# Patient Record
Sex: Female | Born: 1960 | Race: Black or African American | Hispanic: No | Marital: Single | Smoking: Never smoker
Health system: Southern US, Community
[De-identification: ages and names within clinical notes are randomized; demographics above are authoritative.]

## PROBLEM LIST (undated history)

## (undated) DIAGNOSIS — K449 Diaphragmatic hernia without obstruction or gangrene: Secondary | ICD-10-CM

## (undated) DIAGNOSIS — E7800 Pure hypercholesterolemia, unspecified: Secondary | ICD-10-CM

## (undated) DIAGNOSIS — I10 Essential (primary) hypertension: Secondary | ICD-10-CM

## (undated) DIAGNOSIS — E119 Type 2 diabetes mellitus without complications: Secondary | ICD-10-CM

## (undated) DIAGNOSIS — I2699 Other pulmonary embolism without acute cor pulmonale: Secondary | ICD-10-CM

## (undated) DIAGNOSIS — U099 Post covid-19 condition, unspecified: Secondary | ICD-10-CM

## (undated) DIAGNOSIS — J45909 Unspecified asthma, uncomplicated: Secondary | ICD-10-CM

## (undated) HISTORY — DX: Unspecified asthma, uncomplicated: J45.909

## (undated) HISTORY — PX: CHOLECYSTECTOMY: SHX55

## (undated) HISTORY — DX: Post covid-19 condition, unspecified: U09.9

---

## 2016-04-07 ENCOUNTER — Emergency Department
Admission: EM | Admit: 2016-04-07 | Discharge: 2016-04-08 | Disposition: A | Discharge status: Home / Self Care | Payer: Federal, State, Local not specified - PPO | Attending: Emergency Medicine | Admitting: Emergency Medicine

## 2016-04-07 ENCOUNTER — Emergency Department: Payer: Federal, State, Local not specified - PPO

## 2016-04-07 ENCOUNTER — Encounter: Payer: Self-pay | Admitting: Emergency Medicine

## 2016-04-07 DIAGNOSIS — E119 Type 2 diabetes mellitus without complications: Secondary | ICD-10-CM | POA: Diagnosis not present

## 2016-04-07 DIAGNOSIS — Z7984 Long term (current) use of oral hypoglycemic drugs: Secondary | ICD-10-CM | POA: Diagnosis not present

## 2016-04-07 DIAGNOSIS — M79602 Pain in left arm: Secondary | ICD-10-CM | POA: Diagnosis not present

## 2016-04-07 DIAGNOSIS — R4182 Altered mental status, unspecified: Secondary | ICD-10-CM | POA: Insufficient documentation

## 2016-04-07 DIAGNOSIS — Z79899 Other long term (current) drug therapy: Secondary | ICD-10-CM | POA: Diagnosis not present

## 2016-04-07 DIAGNOSIS — I10 Essential (primary) hypertension: Secondary | ICD-10-CM | POA: Insufficient documentation

## 2016-04-07 DIAGNOSIS — Z792 Long term (current) use of antibiotics: Secondary | ICD-10-CM | POA: Diagnosis not present

## 2016-04-07 DIAGNOSIS — R079 Chest pain, unspecified: Secondary | ICD-10-CM | POA: Insufficient documentation

## 2016-04-07 DIAGNOSIS — J069 Acute upper respiratory infection, unspecified: Secondary | ICD-10-CM | POA: Insufficient documentation

## 2016-04-07 DIAGNOSIS — Z7982 Long term (current) use of aspirin: Secondary | ICD-10-CM | POA: Insufficient documentation

## 2016-04-07 DIAGNOSIS — F4321 Adjustment disorder with depressed mood: Secondary | ICD-10-CM

## 2016-04-07 DIAGNOSIS — F4320 Adjustment disorder, unspecified: Secondary | ICD-10-CM | POA: Diagnosis not present

## 2016-04-07 DIAGNOSIS — R05 Cough: Secondary | ICD-10-CM | POA: Diagnosis present

## 2016-04-07 HISTORY — DX: Type 2 diabetes mellitus without complications: E11.9

## 2016-04-07 HISTORY — DX: Essential (primary) hypertension: I10

## 2016-04-07 HISTORY — DX: Pure hypercholesterolemia, unspecified: E78.00

## 2016-04-07 HISTORY — DX: Diaphragmatic hernia without obstruction or gangrene: K44.9

## 2016-04-07 LAB — CBC
HEMATOCRIT: 44.6 % (ref 35.0–47.0)
Hemoglobin: 15.6 g/dL (ref 12.0–16.0)
MCH: 31.1 pg (ref 26.0–34.0)
MCHC: 35 g/dL (ref 32.0–36.0)
MCV: 88.9 fL (ref 80.0–100.0)
Platelets: 188 10*3/uL (ref 150–440)
RBC: 5.02 MIL/uL (ref 3.80–5.20)
RDW: 13.1 % (ref 11.5–14.5)
WBC: 6.4 10*3/uL (ref 3.6–11.0)

## 2016-04-07 LAB — COMPREHENSIVE METABOLIC PANEL
ALT: 62 U/L — AB (ref 14–54)
AST: 67 U/L — AB (ref 15–41)
Albumin: 4.1 g/dL (ref 3.5–5.0)
Alkaline Phosphatase: 49 U/L (ref 38–126)
Anion gap: 10 (ref 5–15)
BILIRUBIN TOTAL: 1 mg/dL (ref 0.3–1.2)
BUN: 10 mg/dL (ref 6–20)
CHLORIDE: 101 mmol/L (ref 101–111)
CO2: 27 mmol/L (ref 22–32)
CREATININE: 0.84 mg/dL (ref 0.44–1.00)
Calcium: 9.5 mg/dL (ref 8.9–10.3)
GFR calc Af Amer: >60 mL/min (ref >60)
GLUCOSE: 121 mg/dL — AB (ref 65–99)
Potassium: 3.6 mmol/L (ref 3.5–5.1)
Sodium: 138 mmol/L (ref 135–145)
TOTAL PROTEIN: 7 g/dL (ref 6.5–8.1)

## 2016-04-07 LAB — TROPONIN I

## 2016-04-07 MED ORDER — FENTANYL CITRATE (PF) 100 MCG/2ML IJ SOLN
50.0000 ug | Freq: Once | INTRAMUSCULAR | Status: AC
  Administered 2016-04-07: 50 ug via INTRAVENOUS

## 2016-04-07 MED ORDER — FENTANYL CITRATE (PF) 100 MCG/2ML IJ SOLN
INTRAMUSCULAR | Status: AC
  Administered 2016-04-07: 50 ug via INTRAVENOUS
  Filled 2016-04-07: qty 2

## 2016-04-07 MED ORDER — FENTANYL CITRATE (PF) 100 MCG/2ML IJ SOLN
50.0000 ug | Freq: Once | INTRAMUSCULAR | Status: AC
  Administered 2016-04-07: 50 ug via INTRAVENOUS
  Filled 2016-04-07: qty 2

## 2016-04-07 MED ORDER — SODIUM CHLORIDE 0.9 % IV BOLUS (SEPSIS)
1000.0000 mL | Freq: Once | INTRAVENOUS | Status: AC
  Administered 2016-04-07: 1000 mL via INTRAVENOUS

## 2016-04-07 NOTE — ED Notes (Signed)
Pt. Stating Lt. Chest pain, radiating to left arm. Tight cramp pain. Pt. States been fighting sinus infection for 2 weeks, cough, dizziness, congested chest and being under a lot of stress due to fiance dying recently.

## 2016-04-07 NOTE — ED Notes (Signed)
Patient transported to X-ray 

## 2016-04-07 NOTE — ED Notes (Signed)
MD at bedside. 

## 2016-04-07 NOTE — ED Notes (Signed)
Pt to STAT via w/c with no distress noted, brought in by EMS; EMS reports pt with sinus infection few weeks; midsternal palpable CP, BP 195/105, HR 105, skin hot to touch 97.9 temp, 114 FS; ASA PTA; fiance died today

## 2016-04-07 NOTE — ED Notes (Addendum)
Pt. Family requesting more pain medication at this time, and an update by MD.

## 2016-04-07 NOTE — ED Triage Notes (Signed)
Pt presents to ED with c/o chest pain and left sided weakness since about 19:00 today, pt also c/o headache x 1 week. 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

## 2016-04-07 NOTE — ED Notes (Signed)
Pt. Reports headache and taking 2 81 mg chew aspirin around 1830

## 2016-04-07 NOTE — ED Provider Notes (Signed)
[facility] Emergency Department Provider Note  Time seen: 11:51 PM  I have reviewed the triage vital signs and the nursing notes.   HISTORY  Chief Complaint Chest Pain    HPI Kristine Gilbert is a 55 y.o. female with a past medical history of diabetes, hypertension, hyperlipidemia who presents the emergency department for chest pain, left arm pain, cough and congestion. According to the patient for the past 2 weeks she has been coughing, feeling congestion in her chest with mucus production. States she has been having chest pain, worse over the past 2 days mostly located in the left chest and left arm. Patient states today she found out her fianc has died, and the chest pain was worse so she came to the emergency department. Here the patient states left chest pain, some left arm pain, cough. Denies fever. Saw her primary care doctor 2 days ago who placed her on antibiotics. Patient is quiet with a flat affect, does not make eye contact. Describes her chest pain is moderate, worse with cough.  Past Medical History:  Diagnosis Date  . Diabetes mellitus without complication (HCC)   . Hiatal hernia   . Hypercholesteremia   . Hypertension     There are no active problems to display for this patient.   Past Surgical History:  Procedure Laterality Date  . CHOLECYSTECTOMY      Prior to Admission medications   Not on File    Allergies  Allergen Reactions  . Codeine     No family history on file.  Social History Social History  Substance Use Topics  . Smoking status: Never Smoker  . Smokeless tobacco: Never Used  . Alcohol use No    Review of Systems Constitutional: Negative for fever. Cardiovascular: Positive for chest pain 2 weeks intermittently, worse over the past 2 days. Respiratory: Negative for shortness of breath. Positive for cough Gastrointestinal: Negative for abdominal pain, vomiting and diarrhea. Musculoskeletal: Negative for back  pain. Neurological: Negative for headaches, focal weakness or numbness. 10-point ROS otherwise negative.  ____________________________________________   PHYSICAL EXAM:  VITAL SIGNS: ED Triage Vitals [04/07/16 2036]  Enc Vitals Group     BP (!) 169/87     Pulse Rate (!) 106     Resp 18     Temp 98.6 F (37 C)     Temp Source Oral     SpO2 97 %     Weight 190 lb (86.2 kg)     Height 5\' 4"  (1.626 m)     Head Circumference      Peak Flow      Pain Score 7     Pain Loc      Pain Edu?      Excl. in GC?     Constitutional: Alert and oriented. Well appearing and in no distress. Eyes: Normal exam ENT   Head: Normocephalic and atraumatic.   Mouth/Throat: Mucous membranes are moist. Cardiovascular: Normal rate, regular rhythm. No murmur Respiratory: Normal respiratory effort without tachypnea nor retractions. Breath sounds are clear Gastrointestinal: Soft and nontender. No distention.  Musculoskeletal: Nontender with normal range of motion in all extremities. No lower extremity tenderness or edema. Neurologic:  Normal speech and language. No gross focal neurologic deficits Skin:  Skin is warm, dry and intact.  Psychiatric: Mood and affect are normal. Speech and behavior are normal.   ____________________________________________    EKG  EKG reviewed and interpreted by myself shows normal sinus rhythm at 99 bpm, narrow  QRS, normal axis, normal intervals, nonspecific ST changes without ST elevation.  ____________________________________________    RADIOLOGY  Chest x-ray negative Had negative  ____________________________________________   INITIAL IMPRESSION / ASSESSMENT AND PLAN / ED COURSE  Pertinent labs & imaging results that were available during my care of the patient were reviewed by me and considered in my medical decision making (see chart for details).  Patient presents emergency Department complains of intermittent chest pain for the past 2 weeks,  worsening cough and chest pain over the past 2 days. Also left arm pain with a history of TIAs in the past. Labs are normal including negative troponin. Chest x-ray is negative. CT head is negative. Patient has a very flat affect, found out her fianc died today, is admittedly depressed. I believe some of the patient's symptoms are likely due to psychiatric/mental causes. The patient's medical workup appears within normal limits. I discussed with the patient having albuterol to her antibiotics, patient is agreeable to this plan. Patient does not have SI, but admits to depression, family wishes her to speak to a psychiatrist, the patient is agreeable. She'll stay in the emergency department overnight doctor psychiatry tomorrow.  ____________________________________________   FINAL CLINICAL IMPRESSION(S) / ED DIAGNOSES  Chest pain URI    Minna AntisKevin Shelisha Gautier, MD 04/08/16 [phone]

## 2016-04-08 DIAGNOSIS — F4321 Adjustment disorder with depressed mood: Secondary | ICD-10-CM | POA: Diagnosis not present

## 2016-04-08 LAB — TROPONIN I: Troponin I: <0.03 ng/mL (ref <0.03)

## 2016-04-08 MED ORDER — ALBUTEROL SULFATE HFA 108 (90 BASE) MCG/ACT IN AERS: 2.0000 | INHALATION_SPRAY | Freq: Four times a day (QID) | RESPIRATORY_TRACT | 2 refills | Status: AC | PRN

## 2016-04-08 MED ORDER — BENZONATATE 100 MG PO CAPS: 100.0000 mg | ORAL_CAPSULE | Freq: Four times a day (QID) | ORAL | 0 refills | Status: AC | PRN

## 2016-04-08 NOTE — ED Provider Notes (Signed)
-----------------------------------------   1:42 PM on 04/08/2016 -----------------------------------------  Patient here with acute grief reaction per she is she states that she's feeling much better. There was low suspicion for cardiac enzymes to be positive and low suspicion for ACS PE or dissection and indeed serial enzymes are negative. Patient states she feels much better. She is very emotionally distraught. Psychiatry saw her, my signout was at a psychiatry felt that she was safe for discharge they would send her home. Patient is not suicidal. We will discharge her home at this time. The family's Criss Alvinerince will concern is that a meal tray was not brought to the bedside manner. Appetite expressed that we have multiple different ICU patients simultaneously taking a multiple different resources in the emergency department and is always R objective to bring food trays as a timely manner as possible. In any event, patient does not have any concerns or complaints at this time she is eager to go home. Her blood pressure slowly elevated as she states it does when she gets anxious. But she does not wish to be in the department any longer. Given her reassuring workup, and her negative cardiac enzymes we will discharge her. I will refer her to cardiology as a precaution.   Jeanmarie PlantJames A Maclin Guerrette, MD 04/08/16 1343

## 2016-04-08 NOTE — ED Notes (Addendum)
Pt. Acutely medically cleared by MD, awaiting psych consult. MD verbally ordered this RN to remove pt. IV and to remove from monitor. Order completed at this time.

## 2016-04-08 NOTE — Consult Note (Signed)
[facility] Face-to-Face Psychiatry Consult   Reason for Consult:  Consult in the emergency room for 55 year old woman who came in with chest pain that was probably psychosomatic. Acute grief. Referring Physician:  Burlene Arnt Patient Identification: Kristine Gilbert MRN:  [id number] Principal Diagnosis: Grief Diagnosis:   Patient Active Problem List   Diagnosis Date Noted  . Grief [F43.21] 04/08/2016    Total Time spent with patient: 1 hour  Subjective:   Kristine Gilbert is a 55 y.o. female patient admitted with "I don't know what I'm going to do".  HPI:  Patient seen. Case reviewed with emergency room physician. Chart reviewed. Also spoke with the patient's son who was present. This is a 63 year old woman with a history of hypertension and stroke who came to the emergency room last night with chest pain. Patient appears to be having atypical chest pain with no sign of any cardiac involvement. Consult was requested because of obvious grief. Patient's fianc died yesterday. He had been sick for a couple weeks but it sounds like it did been a pretty tumultuous and terrible experience for the patient. The 2 of them had been together for 20 years. She was not able to sleep at all last night. Mood is tearful sad and upset. Patient has made some comments to her family about thinking she could hear her fianc when there is a rustling and other environmental noises around her. On interview the patient is appropriate in discussing the death of her fianc. Tearful and upset. Able to describe details realistically. Prior to this had been feeling down for these last couple weeks and very anxious because of his illness. Hadn't been sleeping very well but had been taking any prescription medicine that was some assistance. Patient denies having any suicidal intent or plan. Admits to having had passive suicidal thoughts but has strong religious beliefs as well as devotion to her family preventing her from harming herself.  She is not abusing any drugs or alcohol. Patient's family have rallied around her and are making appropriate efforts to take care of her.  Social history: Patient just suffered the loss of her long-term partner yesterday. She has adult children one of whom is living with her and the other of whom has come up from [place] to be with her. Family is hoping to convince her to go back down to [place] where most of her family lives. Patient feels like she will probably follow-up on that. She is not having to be involved with the funeral arrangements as her fianc's family of origin are taking care of that.  Medical history: High blood pressure. History of strokes. No history of heart disease that has been otherwise identified.  Substance abuse history: Not drinking alcohol no drug abuse denies any history of substance abuse problems.  Past Psychiatric History: Patient has been seen by her primary care doctor for anxiety and sleep problems. She was prescribed nortriptyline 20 mg at night to be taken as needed for sleep. She has no history of psychiatric hospitalization or suicide attempts. No history of bipolar disorder no history of psychotic episodes.  Risk to Self: Is patient at risk for suicide?: No Risk to Others:   Prior Inpatient Therapy:   Prior Outpatient Therapy:    Past Medical History:  Past Medical History:  Diagnosis Date  . Diabetes mellitus without complication ([facility])   . Hiatal hernia   . Hypercholesteremia   . Hypertension     Past Surgical History:  Procedure Laterality Date  . CHOLECYSTECTOMY  Family History: No family history on file. Family Psychiatric  History: No known family history of mental health problems Social History:  History  Alcohol Use No     History  Drug use: Unknown    Social History   Social History  . Marital status: Single    Spouse name: N/A  . Number of children: N/A  . Years of education: N/A   Social History Main Topics  .  Smoking status: Never Smoker  . Smokeless tobacco: Never Used  . Alcohol use No  . Drug use: Unknown  . Sexual activity: Not Asked   Other Topics Concern  . None   Social History Narrative  . None   Additional Social History:    Allergies:   Allergies  Allergen Reactions  . Codeine Itching    Labs:  Results for orders placed or performed during the hospital encounter of 04/07/16 (from the past 48 hour(s))  CBC     Status: None   Collection Time: 04/07/16 10:01 PM  Result Value Ref Range   WBC 6.4 3.6 - 11.0 K/uL   RBC 5.02 3.80 - 5.20 MIL/uL   Hemoglobin 15.6 12.0 - 16.0 g/dL   HCT 44.6 35.0 - 47.0 %   MCV 88.9 80.0 - 100.0 fL   MCH 31.1 26.0 - 34.0 pg   MCHC 35.0 32.0 - 36.0 g/dL   RDW 13.1 11.5 - 14.5 %   Platelets 188 150 - 440 K/uL  Comprehensive metabolic panel     Status: Abnormal   Collection Time: 04/07/16 10:01 PM  Result Value Ref Range   Sodium 138 135 - 145 mmol/L   Potassium 3.6 3.5 - 5.1 mmol/L   Chloride 101 101 - 111 mmol/L   CO2 27 22 - 32 mmol/L   Glucose, Bld 121 (H) 65 - 99 mg/dL   BUN 10 6 - 20 mg/dL   Creatinine, Ser 0.84 0.44 - 1.00 mg/dL   Calcium 9.5 8.9 - 10.3 mg/dL   Total Protein 7.0 6.5 - 8.1 g/dL   Albumin 4.1 3.5 - 5.0 g/dL   AST 67 (H) 15 - 41 U/L   ALT 62 (H) 14 - 54 U/L   Alkaline Phosphatase 49 38 - 126 U/L   Total Bilirubin 1.0 0.3 - 1.2 mg/dL   GFR calc non Af Amer >60 >60 mL/min   GFR calc Af Amer >60 >60 mL/min    Comment: (NOTE) The eGFR has been calculated using the CKD EPI equation. This calculation has not been validated in all clinical situations. eGFR's persistently <60 mL/min signify possible Chronic Kidney Disease.    Anion gap 10 5 - 15  Troponin I     Status: None   Collection Time: 04/07/16 10:01 PM  Result Value Ref Range   Troponin I <0.03 <0.03 ng/mL  Troponin I     Status: None   Collection Time: 04/08/16  7:37 AM  Result Value Ref Range   Troponin I <0.03 <0.03 ng/mL    No current  facility-administered medications for this encounter.    Current Outpatient Prescriptions  Medication Sig Dispense Refill  . albuterol (PROVENTIL HFA;VENTOLIN HFA) 108 (90 Base) MCG/ACT inhaler Inhale 2 puffs into the lungs every 6 (six) hours as needed.    Marland Kitchen amoxicillin (AMOXIL) 500 MG capsule Take 500 mg by mouth 3 (three) times daily. 7 more days remaining    . aspirin 81 MG chewable tablet Chew by mouth daily.    Marland Kitchen atorvastatin (LIPITOR) 40  MG tablet Take 40 mg by mouth daily.    . cholecalciferol (VITAMIN D) 1000 units tablet Take 1,000 Units by mouth daily.    . clopidogrel (PLAVIX) 75 MG tablet Take 75 mg by mouth daily.    . furosemide (LASIX) 20 MG tablet Take 20 mg by mouth every other day.    Marland Kitchen glipiZIDE (GLUCOTROL XL) 5 MG 24 hr tablet Take 5 mg by mouth daily with breakfast.    . magnesium oxide (MAG-OX) 400 MG tablet Take 400 mg by mouth 3 (three) times daily.    . metFORMIN (GLUCOPHAGE) 1000 MG tablet Take 1,000 mg by mouth 2 (two) times daily with a meal.    . methocarbamol (ROBAXIN) 750 MG tablet Take 750 mg by mouth 3 (three) times daily.    . metoprolol succinate (TOPROL-XL) 50 MG 24 hr tablet Take 1 tablet by mouth daily.    . nortriptyline (PAMELOR) 10 MG capsule Take 20 mg by mouth at bedtime.    . potassium chloride (K-DUR) 10 MEQ tablet Take 1 tablet by mouth every other day.    . rizatriptan (MAXALT-MLT) 10 MG disintegrating tablet Take 10 mg by mouth as needed for migraine. May repeat in 2 hours if needed    . sitaGLIPtin (JANUVIA) 100 MG tablet Take 100 mg by mouth daily.    . valsartan-hydrochlorothiazide (DIOVAN-HCT) 320-12.5 MG tablet Take 1 tablet by mouth daily.    . vitamin B-12 (CYANOCOBALAMIN) 1000 MCG tablet Take 1,000 mcg by mouth daily.    Marland Kitchen albuterol (PROVENTIL HFA;VENTOLIN HFA) 108 (90 Base) MCG/ACT inhaler Inhale 2 puffs into the lungs every 6 (six) hours as needed for wheezing or shortness of breath. 1 Inhaler 2  . atomoxetine (STRATTERA) 40 MG capsule  Take 40 mg by mouth daily.    . benzonatate (TESSALON PERLES) 100 MG capsule Take 1 capsule (100 mg total) by mouth every 6 (six) hours as needed for cough. 30 capsule 0    Musculoskeletal: Strength & Muscle Tone: within normal limits Gait & Station: normal Patient leans: N/A  Psychiatric Specialty Exam: Physical Exam  Nursing note and vitals reviewed. Constitutional: She appears well-developed and well-nourished.  HENT:  Head: Normocephalic and atraumatic.  Eyes: Conjunctivae are normal. Pupils are equal, round, and reactive to light.  Neck: Normal range of motion.  Cardiovascular: Regular rhythm and normal heart sounds.   Respiratory: Effort normal. No respiratory distress.  GI: Soft.  Musculoskeletal: Normal range of motion.  Neurological: She is alert.  Skin: Skin is warm and dry.  Psychiatric: Judgment normal. Her speech is delayed. She is slowed. Cognition and memory are normal. She exhibits a depressed mood. She expresses no suicidal ideation.    Review of Systems  Constitutional: Negative.   HENT: Negative.   Eyes: Negative.   Respiratory: Negative.   Cardiovascular: Negative.   Gastrointestinal: Negative.   Musculoskeletal: Negative.   Skin: Negative.   Neurological: Negative.   Psychiatric/Behavioral: Positive for depression and hallucinations. Negative for memory loss, substance abuse and suicidal ideas. The patient is nervous/anxious and has insomnia.     Blood pressure (!) 153/95, pulse 92, temperature 98.6 F (37 C), temperature source Oral, resp. rate 18, height _0  (1.626 m), weight 86.2 kg (190 lb), SpO2 98 %.Body mass index is 32.61 kg/m.  General Appearance: Casual  Eye Contact:  Minimal  Speech:  Slow  Volume:  Decreased  Mood:  Depressed  Affect:  Tearful  Thought Process:  Goal Directed  Orientation:  Full (Time,  Place, and Person)  Thought Content:  Logical  Suicidal Thoughts:  No  Homicidal Thoughts:  No  Memory:  Immediate;   Good Recent;    Fair Remote;   Fair  Judgement:  Good  Insight:  Good  Psychomotor Activity:  Decreased  Concentration:  Concentration: Fair  Recall:  [place] of Knowledge:  Good  Language:  Good  Akathisia:  No  Handed:  Right  AIMS (if indicated):     Assets:  Communication Skills Desire for Improvement Housing Resilience Social Support  ADL's:  Intact  Cognition:  WNL  Sleep:        Treatment Plan Summary: Plan 55 year old woman who is having what appears to be very normal grief in response to a terrible loss. The talk about hearing the voice of her fianc is not considered abnormal in this circumstance. Does not appear to be actually psychotic. No evidence of actual intent or plan or thought of harming herself. Patient has been appropriately cooperative and open to assistance from her family. Patient does not require inpatient psychiatric treatment. Spent quite a bit of time doing supportive work with her discussing grief discussing grief management. I don't think she needs any new prescriptions. I encouraged her to use the nortriptyline if it has been helpful at the current dose but not to increase it. Spoke with the patient and her son and suggested that they see an outpatient doctor or therapist if the situation were to get worse. Reviewed symptoms of major depression. Reviewed case with the ER physician. Doesn't require inpatient treatment. Can be released from the ER.  Disposition: Patient does not meet criteria for psychiatric inpatient admission. Supportive therapy provided about ongoing stressors.  Alethia Berthold, MD 04/08/2016 4:39 PM

## 2019-12-08 ENCOUNTER — Other Ambulatory Visit (HOSPITAL_COMMUNITY)
Admission: RE | Admit: 2019-12-08 | Discharge: 2019-12-08 | Disposition: A | Discharge status: Home / Self Care | Payer: Federal, State, Local not specified - PPO | Source: Ambulatory Visit | Attending: Obstetrics and Gynecology | Admitting: Obstetrics and Gynecology

## 2019-12-08 ENCOUNTER — Ambulatory Visit: Payer: Federal, State, Local not specified - PPO | Admitting: Obstetrics and Gynecology

## 2019-12-08 ENCOUNTER — Encounter: Payer: Self-pay | Admitting: Obstetrics and Gynecology

## 2019-12-08 ENCOUNTER — Other Ambulatory Visit: Payer: Self-pay

## 2019-12-08 VITALS — BP 119/87 | HR 91 | Ht 65.0 in | Wt 169.0 lb

## 2019-12-08 DIAGNOSIS — N898 Other specified noninflammatory disorders of vagina: Secondary | ICD-10-CM | POA: Insufficient documentation

## 2019-12-08 DIAGNOSIS — Z01419 Encounter for gynecological examination (general) (routine) without abnormal findings: Secondary | ICD-10-CM | POA: Diagnosis not present

## 2019-12-08 DIAGNOSIS — Z1231 Encounter for screening mammogram for malignant neoplasm of breast: Secondary | ICD-10-CM

## 2019-12-08 NOTE — Progress Notes (Signed)
HPI:      [name] is a 59 y.o. No obstetric history on file. who LMP was No LMP recorded. Patient has had an ablation.  Subjective:   She presents today for her annual examination.  She complains of a vaginal discharge with occasional burning.  She denies new sexual partners.  But is not opposed to being tested for "everything".  She states it has been present for 3 days.  Occasionally is worse with urination but she does not think it is a bladder infection. Patient is in menopause but has had an endometrial ablation.  She has no bleeding.  She does say that she had severe hot flashes but was placed on Zoloft.  It is working well for her(she cannot take any estrogen compounds because she has a history of 2 previous " strokes")    Hx: The following portions of the patient's history were reviewed and updated as appropriate:             She  has a past medical history of Diabetes mellitus without complication (HCC), Hiatal hernia, Hypercholesteremia, and Hypertension. She does not have any pertinent problems on file. She  has a past surgical history that includes Cholecystectomy. Her family history is not on file. She  reports that she has never smoked. She has never used smokeless tobacco. She reports that she does not drink alcohol. No history on file for drug. She has a current medication list which includes the following prescription(s): albuterol, albuterol, aspirin, atomoxetine, atorvastatin, cholecalciferol, furosemide, glipizide, magnesium oxide, metformin, methocarbamol, nortriptyline, potassium chloride, rizatriptan, sertraline, valsartan-hydrochlorothiazide, metoprolol succinate, ozempic (1 mg/dose), sitagliptin, and vitamin b-12. She is allergic to codeine.       Review of Systems:  Review of Systems  Constitutional: Denied constitutional symptoms, night sweats, recent illness, fatigue, fever, insomnia and weight loss.  Eyes: Denied eye symptoms, eye pain, photophobia,  vision change and visual disturbance.  Ears/Nose/Throat/Neck: Denied ear, nose, throat or neck symptoms, hearing loss, nasal discharge, sinus congestion and sore throat.  Cardiovascular: Denied cardiovascular symptoms, arrhythmia, chest pain/pressure, edema, exercise intolerance, orthopnea and palpitations.  Respiratory: Denied pulmonary symptoms, asthma, pleuritic pain, productive sputum, cough, dyspnea and wheezing.  Gastrointestinal: Denied, gastro-esophageal reflux, melena, nausea and vomiting.  Genitourinary: See HPI for additional information.  Musculoskeletal: Denied musculoskeletal symptoms, stiffness, swelling, muscle weakness and myalgia.  Dermatologic: Denied dermatology symptoms, rash and scar.  Neurologic: Denied neurology symptoms, dizziness, headache, neck pain and syncope.  Psychiatric: Denied psychiatric symptoms, anxiety and depression.  Endocrine: Denied endocrine symptoms including hot flashes and night sweats.   Meds:   Current Outpatient Medications on File Prior to Visit  Medication Sig Dispense Refill  . albuterol (PROVENTIL HFA;VENTOLIN HFA) 108 (90 Base) MCG/ACT inhaler Inhale 2 puffs into the lungs every 6 (six) hours as needed for wheezing or shortness of breath. 1 Inhaler 2  . albuterol (PROVENTIL HFA;VENTOLIN HFA) 108 (90 Base) MCG/ACT inhaler Inhale 2 puffs into the lungs every 6 (six) hours as needed.    Marland Kitchen aspirin 81 MG chewable tablet Chew by mouth daily.    Marland Kitchen atomoxetine (STRATTERA) 40 MG capsule Take 40 mg by mouth daily.    Marland Kitchen atorvastatin (LIPITOR) 40 MG tablet Take 40 mg by mouth daily.    . cholecalciferol (VITAMIN D) 1000 units tablet Take 1,000 Units by mouth daily.    . furosemide (LASIX) 20 MG tablet Take 20 mg by mouth every other day.    Marland Kitchen glipiZIDE (GLUCOTROL XL) 5 MG 24  hr tablet Take 5 mg by mouth daily with breakfast.    . magnesium oxide (MAG-OX) 400 MG tablet Take 400 mg by mouth 3 (three) times daily.    . metFORMIN (GLUCOPHAGE) 1000 MG  tablet Take 1,000 mg by mouth 2 (two) times daily with a meal.    . methocarbamol (ROBAXIN) 750 MG tablet Take 750 mg by mouth 3 (three) times daily.    . nortriptyline (PAMELOR) 10 MG capsule Take 20 mg by mouth at bedtime.    . potassium chloride (K-DUR) 10 MEQ tablet Take 1 tablet by mouth every other day.    . rizatriptan (MAXALT-MLT) 10 MG disintegrating tablet Take 10 mg by mouth as needed for migraine. May repeat in 2 hours if needed    . sertraline (ZOLOFT) 25 MG tablet Take by mouth.    . valsartan-hydrochlorothiazide (DIOVAN-HCT) 320-12.5 MG tablet Take 1 tablet by mouth daily.    . metoprolol succinate (TOPROL-XL) 50 MG 24 hr tablet Take 1 tablet by mouth daily.    Marland Kitchen OZEMPIC, 1 MG/DOSE, 4 MG/3ML SOPN ADMINISTER 1 MG UNDER THE SKIN 1 TIME A WEEK    . sitaGLIPtin (JANUVIA) 100 MG tablet Take 100 mg by mouth daily.    . vitamin B-12 (CYANOCOBALAMIN) 1000 MCG tablet Take 1,000 mcg by mouth daily.     No current facility-administered medications on file prior to visit.    Objective:     Vitals:   12/08/19 1444  BP: 119/87  Pulse: 91              Physical examination General NAD, Conversant  HEENT Atraumatic; Op clear with mmm.  Normo-cephalic. Pupils reactive. Anicteric sclerae  Thyroid/Neck Smooth without nodularity or enlargement. Normal ROM.  Neck Supple.  Skin No rashes, lesions or ulceration. Normal palpated skin turgor. No nodularity.  Breasts: No masses or discharge.  Symmetric.  No axillary adenopathy.  Lungs: Clear to auscultation.No rales or wheezes. Normal Respiratory effort, no retractions.  Heart: NSR.  No murmurs or rubs appreciated. No periferal edema  Abdomen: Soft.  Non-tender.  No masses.  No HSM. No hernia  Extremities: Moves all appropriately.  Normal ROM for age. No lymphadenopathy.  Neuro: Oriented to PPT.  Normal mood. Normal affect.     Pelvic:   Vulva: Normal appearance.  No lesions.  Vagina: No lesions or abnormalities noted.  Support: Normal  pelvic support.  Urethra No masses tenderness or scarring.  Meatus Normal size without lesions or prolapse.  Cervix: Normal appearance.  No lesions.  Anus: Normal exam.  No lesions.  Perineum: Normal exam.  No lesions.        Bimanual   Uterus: Normal size.  Non-tender.  Mobile.  AV.  Adnexae: No masses.  Non-tender to palpation.  Cul-de-sac: Negative for abnormality.      Assessment:    No obstetric history on file. Patient Active Problem List   Diagnosis Date Noted  . Grief 04/08/2016     1. Well woman exam with routine gynecological exam   2. Screening mammogram, encounter for   3. Vaginal discharge     Normal exam   Plan:            1.  Basic Screening Recommendations The basic screening recommendations for asymptomatic women were discussed with the patient during her visit.  The age-appropriate recommendations were discussed with her and the rational for the tests reviewed.  When I am informed by the patient that another primary care physician has previously  obtained the age-appropriate tests and they are up-to-date, only outstanding tests are ordered and referrals given as necessary.  Abnormal results of tests will be discussed with her when all of her results are completed.  Routine preventative health maintenance measures emphasized: Exercise/Diet/Weight control, Tobacco Warnings, Alcohol/Substance use risks and Stress Management Pap next year -mammogram ordered -patient gets blood work through PCP 2. Nuswab sent -we will contact patient's if any results positive. Orders Orders Placed This Encounter  Procedures  . MM 3D SCREEN BREAST BILATERAL    No orders of the defined types were placed in this encounter.       F/U  Return in about 1 year (around 12/07/2020) for We will contact her with any abnormal test results.  Elonda Husky, M.D. 12/08/2019 3:33 PM

## 2019-12-12 LAB — CERVICOVAGINAL ANCILLARY ONLY
Bacterial Vaginitis (gardnerella): NEGATIVE
Candida Glabrata: NEGATIVE
Candida Vaginitis: NEGATIVE
Chlamydia: NEGATIVE
Comment: NEGATIVE
Comment: NEGATIVE
Comment: NEGATIVE
Comment: NEGATIVE
Comment: NEGATIVE
Comment: NORMAL
Neisseria Gonorrhea: NEGATIVE
Trichomonas: NEGATIVE

## 2020-12-14 ENCOUNTER — Other Ambulatory Visit: Payer: Self-pay

## 2020-12-14 ENCOUNTER — Encounter: Payer: Self-pay | Admitting: Obstetrics and Gynecology

## 2020-12-14 ENCOUNTER — Other Ambulatory Visit (HOSPITAL_COMMUNITY)
Admission: RE | Admit: 2020-12-14 | Discharge: 2020-12-14 | Disposition: A | Discharge status: Home / Self Care | Payer: Federal, State, Local not specified - PPO | Source: Ambulatory Visit | Attending: Obstetrics and Gynecology | Admitting: Obstetrics and Gynecology

## 2020-12-14 ENCOUNTER — Ambulatory Visit (INDEPENDENT_AMBULATORY_CARE_PROVIDER_SITE_OTHER): Payer: Federal, State, Local not specified - PPO | Admitting: Obstetrics and Gynecology

## 2020-12-14 VITALS — BP 124/81 | HR 89 | Ht 64.0 in | Wt 165.4 lb

## 2020-12-14 DIAGNOSIS — Z124 Encounter for screening for malignant neoplasm of cervix: Secondary | ICD-10-CM | POA: Insufficient documentation

## 2020-12-14 DIAGNOSIS — Z1231 Encounter for screening mammogram for malignant neoplasm of breast: Secondary | ICD-10-CM

## 2020-12-14 DIAGNOSIS — R319 Hematuria, unspecified: Secondary | ICD-10-CM

## 2020-12-14 DIAGNOSIS — R300 Dysuria: Secondary | ICD-10-CM

## 2020-12-14 DIAGNOSIS — Z01419 Encounter for gynecological examination (general) (routine) without abnormal findings: Secondary | ICD-10-CM

## 2020-12-14 LAB — POCT URINALYSIS DIPSTICK
Bilirubin, UA: NEGATIVE
Glucose, UA: NEGATIVE
Ketones, UA: NEGATIVE
Leukocytes, UA: NEGATIVE
Nitrite, UA: NEGATIVE
Protein, UA: NEGATIVE
Spec Grav, UA: 1.01 (ref 1.010–1.025)
Urobilinogen, UA: 0.2 E.U./dL
pH, UA: 6.5 (ref 5.0–8.0)

## 2020-12-14 NOTE — Progress Notes (Signed)
HPI:      [name] is a 60 y.o. No obstetric history on file. who LMP was No LMP recorded. Patient has had an ablation.  Subjective:   She presents today for her annual examination.  She states that she is doing well.  She does complain of occasional burning with urination.  She reports no vaginal bleeding. She continues to have occasional hot flashes but these are not disabling as they used to be.  She takes Zoloft to control her hot flashes.  She is unable to take estrogen because of a history of 2 previous strokes. She has had an endometrial ablation.    Hx: The following portions of the patient's history were reviewed and updated as appropriate:             She  has a past medical history of Diabetes mellitus without complication (HCC), Hiatal hernia, Hypercholesteremia, and Hypertension. She does not have any pertinent problems on file. She  has a past surgical history that includes Cholecystectomy. Her family history includes Cancer in her mother; Diabetes in her father and mother; Throat cancer in her father. She  reports that she has never smoked. She has never used smokeless tobacco. She reports previous drug use. She reports that she does not drink alcohol. She has a current medication list which includes the following prescription(s): albuterol, aspirin, atomoxetine, atorvastatin, cholecalciferol, furosemide, glipizide, magnesium oxide, metformin, methocarbamol, nortriptyline, ozempic (1 mg/dose), potassium chloride, rizatriptan, valsartan-hydrochlorothiazide, vitamin b-12, metoprolol succinate, sertraline, and sitagliptin. She is allergic to codeine.       Review of Systems:  Review of Systems  Constitutional: Denied constitutional symptoms, night sweats, recent illness, fatigue, fever, insomnia and weight loss.  Eyes: Denied eye symptoms, eye pain, photophobia, vision change and visual disturbance.  Ears/Nose/Throat/Neck: Denied ear, nose, throat or neck symptoms,  hearing loss, nasal discharge, sinus congestion and sore throat.  Cardiovascular: Denied cardiovascular symptoms, arrhythmia, chest pain/pressure, edema, exercise intolerance, orthopnea and palpitations.  Respiratory: Denied pulmonary symptoms, asthma, pleuritic pain, productive sputum, cough, dyspnea and wheezing.  Gastrointestinal: Denied, gastro-esophageal reflux, melena, nausea and vomiting.  Genitourinary: Denied genitourinary symptoms including symptomatic vaginal discharge, pelvic relaxation issues, and urinary complaints.  Musculoskeletal: Denied musculoskeletal symptoms, stiffness, swelling, muscle weakness and myalgia.  Dermatologic: Denied dermatology symptoms, rash and scar.  Neurologic: Denied neurology symptoms, dizziness, headache, neck pain and syncope.  Psychiatric: Denied psychiatric symptoms, anxiety and depression.  Endocrine: Denied endocrine symptoms including hot flashes and night sweats.   Meds:   Current Outpatient Medications on File Prior to Visit  Medication Sig Dispense Refill  . albuterol (PROVENTIL HFA;VENTOLIN HFA) 108 (90 Base) MCG/ACT inhaler Inhale 2 puffs into the lungs every 6 (six) hours as needed for wheezing or shortness of breath. 1 Inhaler 2  . aspirin 81 MG chewable tablet Chew by mouth daily.    Marland Kitchen atomoxetine (STRATTERA) 40 MG capsule Take 40 mg by mouth daily.    Marland Kitchen atorvastatin (LIPITOR) 40 MG tablet Take 40 mg by mouth daily.    . cholecalciferol (VITAMIN D) 1000 units tablet Take 1,000 Units by mouth daily.    . furosemide (LASIX) 20 MG tablet Take 20 mg by mouth every other day.    Marland Kitchen glipiZIDE (GLUCOTROL XL) 5 MG 24 hr tablet Take 5 mg by mouth daily with breakfast.    . magnesium oxide (MAG-OX) 400 MG tablet Take 400 mg by mouth 3 (three) times daily.    . metFORMIN (GLUCOPHAGE) 1000 MG tablet Take 1,000 mg  by mouth 2 (two) times daily with a meal.    . methocarbamol (ROBAXIN) 750 MG tablet Take 750 mg by mouth 3 (three) times daily.    .  nortriptyline (PAMELOR) 10 MG capsule Take 20 mg by mouth at bedtime.    Marland Kitchen OZEMPIC, 1 MG/DOSE, 4 MG/3ML SOPN ADMINISTER 1 MG UNDER THE SKIN 1 TIME A WEEK    . potassium chloride (K-DUR) 10 MEQ tablet Take 1 tablet by mouth every other day.    . rizatriptan (MAXALT-MLT) 10 MG disintegrating tablet Take 10 mg by mouth as needed for migraine. May repeat in 2 hours if needed    . valsartan-hydrochlorothiazide (DIOVAN-HCT) 320-12.5 MG tablet Take 1 tablet by mouth daily.    . vitamin B-12 (CYANOCOBALAMIN) 1000 MCG tablet Take 1,000 mcg by mouth daily.    . metoprolol succinate (TOPROL-XL) 50 MG 24 hr tablet Take 1 tablet by mouth daily. (Patient not taking: Reported on 12/14/2020)    . sertraline (ZOLOFT) 25 MG tablet Take by mouth.    . sitaGLIPtin (JANUVIA) 100 MG tablet Take 100 mg by mouth daily. (Patient not taking: Reported on 12/14/2020)     No current facility-administered medications on file prior to visit.          Objective:     Vitals:   12/14/20 1114  BP: 124/81  Pulse: 89    Filed Weights   12/14/20 1114  Weight: 165 lb 6.4 oz (75 kg)              Physical examination General NAD, Conversant  HEENT Atraumatic; Op clear with mmm.  Normo-cephalic. Pupils reactive. Anicteric sclerae  Thyroid/Neck Smooth without nodularity or enlargement. Normal ROM.  Neck Supple.  Skin No rashes, lesions or ulceration. Normal palpated skin turgor. No nodularity.  Breasts: No masses or discharge.  Symmetric.  No axillary adenopathy.  Lungs: Clear to auscultation.No rales or wheezes. Normal Respiratory effort, no retractions.  Heart: NSR.  No murmurs or rubs appreciated. No periferal edema  Abdomen: Soft.  Non-tender.  No masses.  No HSM. No hernia  Extremities: Moves all appropriately.  Normal ROM for age. No lymphadenopathy.  Neuro: Oriented to PPT.  Normal mood. Normal affect.     Pelvic:   Vulva: Normal appearance.  No lesions.  Vagina: No lesions or abnormalities noted.  Moderate  atrophy noted  Support: Normal pelvic support.  Urethra No masses tenderness or scarring.  Meatus Normal size without lesions or prolapse.  Cervix: Normal appearance.  No lesions.  Anus: Normal exam.  No lesions.  Perineum: Normal exam.  No lesions.        Bimanual   Uterus: Normal size.  Non-tender.  Mobile.  AV.  Adnexae: No masses.  Non-tender to palpation.  Cul-de-sac: Negative for abnormality.      Assessment:    No obstetric history on file. Patient Active Problem List   Diagnosis Date Noted  . Grief 04/08/2016     1. Well woman exam with routine gynecological exam   2. Screening mammogram, encounter for   3. Dysuria   4. Screening for cervical cancer   5. Hematuria of unknown etiology    Patient states she often has hematuria and had a urologic work-up for this and they found no diagnosis.     Plan:            1.  Basic Screening Recommendations The basic screening recommendations for asymptomatic women were discussed with the patient during her visit.  The  age-appropriate recommendations were discussed with her and the rational for the tests reviewed.  When I am informed by the patient that another primary care physician has previously obtained the age-appropriate tests and they are up-to-date, only outstanding tests are ordered and referrals given as necessary.  Abnormal results of tests will be discussed with her when all of her results are completed.  Routine preventative health maintenance measures emphasized: Exercise/Diet/Weight control, Tobacco Warnings, Alcohol/Substance use risks and Stress Management Pap performed-mammogram ordered. 2.  Hematuria noted - urine sent for C&S.  Orders Orders Placed This Encounter  Procedures  . Urine Culture  . MM 3D SCREEN BREAST BILATERAL  . POCT urinalysis dipstick    No orders of the defined types were placed in this encounter.         F/U  No follow-ups on file.  Elonda Husky, M.D. 12/14/2020 11:45 AM

## 2020-12-17 LAB — CYTOLOGY - PAP
Comment: NEGATIVE
Diagnosis: NEGATIVE
High risk HPV: NEGATIVE

## 2020-12-17 LAB — URINE CULTURE: Organism ID, Bacteria: NO GROWTH

## 2021-06-13 ENCOUNTER — Ambulatory Visit
Admission: RE | Admit: 2021-06-13 | Discharge: 2021-06-13 | Disposition: A | Discharge status: Home / Self Care | Payer: Federal, State, Local not specified - PPO | Source: Ambulatory Visit | Attending: Family Medicine | Admitting: Family Medicine

## 2021-06-13 ENCOUNTER — Ambulatory Visit
Admission: RE | Admit: 2021-06-13 | Discharge: 2021-06-13 | Disposition: A | Discharge status: Home / Self Care | Payer: Federal, State, Local not specified - PPO | Attending: Family Medicine | Admitting: Family Medicine

## 2021-06-13 ENCOUNTER — Other Ambulatory Visit: Payer: Self-pay | Admitting: Family Medicine

## 2021-06-13 ENCOUNTER — Other Ambulatory Visit: Payer: Self-pay

## 2021-06-13 DIAGNOSIS — M25562 Pain in left knee: Secondary | ICD-10-CM

## 2021-10-21 ENCOUNTER — Other Ambulatory Visit: Payer: Self-pay

## 2021-10-21 ENCOUNTER — Ambulatory Visit
Admission: RE | Admit: 2021-10-21 | Discharge: 2021-10-21 | Disposition: A | Discharge status: Home / Self Care | Payer: Federal, State, Local not specified - PPO | Source: Ambulatory Visit | Attending: Internal Medicine | Admitting: Internal Medicine

## 2021-10-21 VITALS — BP 139/86 | HR 87 | Temp 98.6°F | Resp 16

## 2021-10-21 DIAGNOSIS — H6121 Impacted cerumen, right ear: Secondary | ICD-10-CM

## 2021-10-21 DIAGNOSIS — R519 Headache, unspecified: Secondary | ICD-10-CM | POA: Diagnosis not present

## 2021-10-21 DIAGNOSIS — M62838 Other muscle spasm: Secondary | ICD-10-CM

## 2021-10-21 DIAGNOSIS — G44209 Tension-type headache, unspecified, not intractable: Secondary | ICD-10-CM

## 2021-10-21 MED ORDER — KETOROLAC TROMETHAMINE 10 MG PO TABS: 10.0000 mg | ORAL_TABLET | Freq: Four times a day (QID) | ORAL | 0 refills | Status: DC | PRN

## 2021-10-21 MED ORDER — BACLOFEN 10 MG PO TABS: 10.0000 mg | ORAL_TABLET | Freq: Three times a day (TID) | ORAL | 0 refills | Status: DC

## 2021-10-21 MED ORDER — PSEUDOEPHEDRINE HCL ER 120 MG PO TB12: 120.0000 mg | ORAL_TABLET | Freq: Two times a day (BID) | ORAL | 0 refills | Status: DC

## 2021-10-21 NOTE — ED Provider Notes (Signed)
Kristine Gilbert    CSN: [id number] Arrival date & time: 10/21/21  1148      History   Chief Complaint Chief Complaint  Patient presents with   Headache   Nasal Congestion   Otalgia   Neck Pain    HPI Kristine Gilbert is a 61 y.o. female who presents with HA, nose congestion, bilateral ear pain and neck pain x 1 week. Has had sinus pressure. Denies having a fever.  Was very chilli for 2 days only, has not had body aches, but has aches on her L neck and L trap area. Denies an injury.     Past Medical History:  Diagnosis Date   Diabetes mellitus without complication (HCC)    Hiatal hernia    Hypercholesteremia    Hypertension     Patient Active Problem List   Diagnosis Date Noted   Grief 04/08/2016    Past Surgical History:  Procedure Laterality Date   CHOLECYSTECTOMY      OB History   No obstetric history on file.      Home Medications    Prior to Admission medications   Medication Sig Start Date End Date Taking? Authorizing Provider  baclofen (LIORESAL) 10 MG tablet Take 1 tablet (10 mg total) by mouth 3 (three) times daily. 10/21/21  Yes Rodriguez-Southworth, Nettie Elm, PA-C  ketorolac (TORADOL) 10 MG tablet Take 1 tablet (10 mg total) by mouth every 6 (six) hours as needed. Prn headache 10/21/21  Yes Rodriguez-Southworth, Nettie Elm, PA-C  pseudoephedrine (SUDAFED 12 HOUR) 120 MG 12 hr tablet Take 1 tablet (120 mg total) by mouth 2 (two) times daily. 10/21/21  Yes Rodriguez-Southworth, Nettie Elm, PA-C  albuterol (PROVENTIL HFA;VENTOLIN HFA) 108 (90 Base) MCG/ACT inhaler Inhale 2 puffs into the lungs every 6 (six) hours as needed for wheezing or shortness of breath. 04/08/16   Minna Antis, MD  aspirin 81 MG chewable tablet Chew by mouth daily.    [provider]  atomoxetine (STRATTERA) 40 MG capsule Take 40 mg by mouth daily.    [provider]  atorvastatin (LIPITOR) 40 MG tablet Take 40 mg by mouth daily.    [provider]   cholecalciferol (VITAMIN D) 1000 units tablet Take 1,000 Units by mouth daily.    [provider]  furosemide (LASIX) 20 MG tablet Take 20 mg by mouth every other day.    [provider]  glipiZIDE (GLUCOTROL XL) 5 MG 24 hr tablet Take 5 mg by mouth daily with breakfast.    [provider]  magnesium oxide (MAG-OX) 400 MG tablet Take 400 mg by mouth 3 (three) times daily.    [provider]  metFORMIN (GLUCOPHAGE) 1000 MG tablet Take 1,000 mg by mouth 2 (two) times daily with a meal.    [provider]  methocarbamol (ROBAXIN) 750 MG tablet Take 750 mg by mouth 3 (three) times daily.    [provider]  metoprolol succinate (TOPROL-XL) 50 MG 24 hr tablet Take 1 tablet by mouth daily. Patient not taking: Reported on 12/14/2020 12/28/15   [provider]  nortriptyline (PAMELOR) 10 MG capsule Take 20 mg by mouth at bedtime.    [provider]  OZEMPIC, 1 MG/DOSE, 4 MG/3ML SOPN ADMINISTER 1 MG UNDER THE SKIN 1 TIME A WEEK 11/28/19   [provider]  potassium chloride (K-DUR) 10 MEQ tablet Take 1 tablet by mouth every other day. 12/28/15   [provider]  rizatriptan (MAXALT-MLT) 10 MG disintegrating tablet Take 10  mg by mouth as needed for migraine. May repeat in 2 hours if needed    [provider]  sertraline (ZOLOFT) 25 MG tablet Take by mouth. 01/25/18 03/21/20  [provider]  sitaGLIPtin (JANUVIA) 100 MG tablet Take 100 mg by mouth daily. Patient not taking: Reported on 12/14/2020    [provider]  valsartan-hydrochlorothiazide (DIOVAN-HCT) 320-12.5 MG tablet Take 1 tablet by mouth daily.    [provider]  vitamin B-12 (CYANOCOBALAMIN) 1000 MCG tablet Take 1,000 mcg by mouth daily.    [provider]    Family History Family History  Problem Relation Age of Onset   Diabetes Mother    Cancer Mother    Diabetes Father    Throat cancer Father     Social  History Social History   Tobacco Use   Smoking status: Never   Smokeless tobacco: Never  Substance Use Topics   Alcohol use: No   Drug use: Not Currently     Allergies   Codeine   Review of Systems Review of Systems  Constitutional:  Positive for chills and fatigue. Negative for activity change, appetite change, diaphoresis and fever.  HENT:  Positive for congestion, ear pain and postnasal drip. Negative for ear discharge, sore throat and trouble swallowing.   Eyes:  Negative for photophobia, discharge and visual disturbance.  Respiratory:  Negative for cough.   Musculoskeletal:  Positive for neck pain and neck stiffness. Negative for myalgias.  Neurological:  Positive for headaches.  Hematological:  Negative for adenopathy.    Physical Exam Triage Vital Signs ED Triage Vitals  Enc Vitals Group     BP 10/21/21 1212 139/86     Pulse Rate 10/21/21 1212 87     Resp 10/21/21 1212 16     Temp 10/21/21 1212 98.6 F (37 C)     Temp Source 10/21/21 1212 Oral     SpO2 10/21/21 1212 95 %     Weight --      Height --      Head Circumference --      Peak Flow --      Pain Score 10/21/21 1220 6     Pain Loc --      Pain Edu? --      Excl. in GC? --    No data found.  Updated Vital Signs BP 139/86 (BP Location: Right Arm)    Pulse 87    Temp 98.6 F (37 C) (Oral)    Resp 16    SpO2 98%   Visual Acuity Right Eye Distance:   Left Eye Distance:   Bilateral Distance:    Right Eye Near:   Left Eye Near:    Bilateral Near:    Repeated pulse ox was 98% Physical Exam Vitals and nursing note reviewed.  Constitutional:      General: She is not in acute distress.    Appearance: She is not toxic-appearing.  HENT:     Head: Normocephalic.     Right Ear: Tympanic membrane, ear canal and external ear normal.     Left Ear: Tympanic membrane, ear canal and external ear normal.     Ears:     Comments: R TM was initially not visible, but after lavage it was normal.     Nose:  Nose normal.     Comments: Has normal sinus transluminations    Mouth/Throat:     Mouth: Mucous membranes are moist.     Pharynx: Oropharynx is clear.  Eyes:     General: No scleral icterus.    Conjunctiva/sclera: Conjunctivae normal.  Neck:     Comments: Has tense and tender L  upper trap muscle Cardiovascular:     Rate and Rhythm: Normal rate and regular rhythm.  Pulmonary:     Effort: Pulmonary effort is normal.     Breath sounds: Normal breath sounds.  Musculoskeletal:        General: Normal range of motion.     Cervical back: Neck supple.  Skin:    General: Skin is warm and dry.     Findings: No rash.  Neurological:     Mental Status: She is alert and oriented to person, place, and time.     Gait: Gait normal.     Deep Tendon Reflexes: Reflexes normal.  Psychiatric:        Mood and Affect: Mood normal.        Behavior: Behavior normal.        Thought Content: Thought content normal.        Judgment: Judgment normal.     UC Treatments / Results  Labs (all labs ordered are listed, but only abnormal results are displayed) Labs Reviewed  NOVEL CORONAVIRUS, NAA    EKG   Radiology No results found.  Procedures Procedures (including critical care time)  Medications Ordered in UC Medications - No data to display  Initial Impression / Assessment and Plan / UC Course  I have reviewed the triage vital signs and the nursing notes. URI L neck muscle spasm I placed her on Sudafed, Baclofen and Toradol as noted.  Covid test pending and we will inform her if positive.    Final Clinical Impressions(s) / UC Diagnoses   Final diagnoses:  Acute non intractable tension-type headache  Sinus headache  Hearing loss due to cerumen impaction, right  Neck muscle spasm     Discharge Instructions      We will call you when the Covid test comes back.      ED Prescriptions     Medication Sig Dispense Auth. Provider   pseudoephedrine (SUDAFED 12 HOUR) 120 MG 12  hr tablet Take 1 tablet (120 mg total) by mouth 2 (two) times daily. 30 tablet Rodriguez-Southworth, Nettie ElmSylvia, PA-C   baclofen (LIORESAL) 10 MG tablet Take 1 tablet (10 mg total) by mouth 3 (three) times daily. 30 each Rodriguez-Southworth, Nettie ElmSylvia, PA-C   ketorolac (TORADOL) 10 MG tablet Take 1 tablet (10 mg total) by mouth every 6 (six) hours as needed. Prn headache 15 tablet Rodriguez-Southworth, Nettie ElmSylvia, PA-C      PDMP not reviewed this encounter.   Garey HamRodriguez-Southworth, Axten Pascucci, [place]-C 10/21/21 1746

## 2021-10-21 NOTE — ED Triage Notes (Signed)
Pt here with headache, congestion, neck pain and bilateral otalgia x 1 week  ?

## 2021-10-21 NOTE — Discharge Instructions (Addendum)
We will call you when the Covid test comes back.  ?

## 2021-10-22 LAB — NOVEL CORONAVIRUS, NAA: SARS-CoV-2, NAA: NOT DETECTED

## 2021-11-06 ENCOUNTER — Ambulatory Visit
Admission: EM | Admit: 2021-11-06 | Discharge: 2021-11-06 | Disposition: A | Discharge status: Home / Self Care | Payer: Federal, State, Local not specified - PPO | Attending: Physician Assistant | Admitting: Physician Assistant

## 2021-11-06 ENCOUNTER — Ambulatory Visit (INDEPENDENT_AMBULATORY_CARE_PROVIDER_SITE_OTHER): Payer: Federal, State, Local not specified - PPO

## 2021-11-06 DIAGNOSIS — R059 Cough, unspecified: Secondary | ICD-10-CM

## 2021-11-06 DIAGNOSIS — J209 Acute bronchitis, unspecified: Secondary | ICD-10-CM | POA: Diagnosis not present

## 2021-11-06 MED ORDER — PREDNISONE 50 MG PO TABS: ORAL_TABLET | ORAL | 0 refills | Status: DC

## 2021-11-06 NOTE — ED Triage Notes (Signed)
Pt is here to cough and congestion for several weeks. She has been taking prednisone for a week with no relief.    ?

## 2021-11-06 NOTE — Discharge Instructions (Addendum)
Continue taking antibiotics.

## 2021-11-08 NOTE — ED Provider Notes (Signed)
?[facility] ? ? ? ?CSN: [id number] ?Arrival date & time: 11/06/21  1654 ? ? ?  ? ?History   ?Chief Complaint ?Chief Complaint  ?Patient presents with  ? Cough  ?  Congestion - Entered by patient  ? ? ?HPI ?Kristine Gilbert is a 61 y.o. female.  ? ?Patient complains of cough and congestion for several weeks she reports the symptoms are progressively getting worse patient reports that she is coughing up some colored phlegm she has been on prednisone and antibiotics she reports she has 3 days left of her antibiotics she is out of prednisone ? ? ?Cough ?Cough characteristics:  Non-productive ? ?Past Medical History:  ?Diagnosis Date  ? Diabetes mellitus without complication (HCC)   ? Hiatal hernia   ? Hypercholesteremia   ? Hypertension   ? ? ?Patient Active Problem List  ? Diagnosis Date Noted  ? Grief 04/08/2016  ? ? ?Past Surgical History:  ?Procedure Laterality Date  ? CHOLECYSTECTOMY    ? ? ?OB History   ?No obstetric history on file. ?  ? ? ? ?Home Medications   ? ?Prior to Admission medications   ?Medication Sig Start Date End Date Taking? Authorizing Provider  ?predniSONE (DELTASONE) 50 MG tablet One tablet a day 11/06/21  Yes Elson Areas, PA-C  ?albuterol (PROVENTIL HFA;VENTOLIN HFA) 108 (90 Base) MCG/ACT inhaler Inhale 2 puffs into the lungs every 6 (six) hours as needed for wheezing or shortness of breath. 04/08/16   Minna Antis, MD  ?aspirin 81 MG chewable tablet Chew by mouth daily.    [provider]  ?atomoxetine (STRATTERA) 40 MG capsule Take 40 mg by mouth daily.    [provider]  ?atorvastatin (LIPITOR) 40 MG tablet Take 40 mg by mouth daily.    [provider]  ?baclofen (LIORESAL) 10 MG tablet Take 1 tablet (10 mg total) by mouth 3 (three) times daily. 10/21/21   Rodriguez-Southworth, Nettie Elm, PA-C  ?cholecalciferol (VITAMIN D) 1000 units tablet Take 1,000 Units by mouth daily.    [provider]  ?furosemide (LASIX) 20 MG tablet Take 20 mg by  mouth every other day.    [provider]  ?glipiZIDE (GLUCOTROL XL) 5 MG 24 hr tablet Take 5 mg by mouth daily with breakfast.    [provider]  ?ketorolac (TORADOL) 10 MG tablet Take 1 tablet (10 mg total) by mouth every 6 (six) hours as needed. Prn headache 10/21/21   Rodriguez-Southworth, Nettie Elm, PA-C  ?magnesium oxide (MAG-OX) 400 MG tablet Take 400 mg by mouth 3 (three) times daily.    [provider]  ?metFORMIN (GLUCOPHAGE) 1000 MG tablet Take 1,000 mg by mouth 2 (two) times daily with a meal.    [provider]  ?methocarbamol (ROBAXIN) 750 MG tablet Take 750 mg by mouth 3 (three) times daily.    [provider]  ?metoprolol succinate (TOPROL-XL) 50 MG 24 hr tablet Take 1 tablet by mouth daily. ?Patient not taking: Reported on 12/14/2020 12/28/15   [provider]  ?nortriptyline (PAMELOR) 10 MG capsule Take 20 mg by mouth at bedtime.    [provider]  ?OZEMPIC, 1 MG/DOSE, 4 MG/3ML SOPN ADMINISTER 1 MG UNDER THE SKIN 1 TIME A WEEK 11/28/19   [provider]  ?potassium chloride (K-DUR) 10 MEQ tablet Take 1 tablet by mouth every other day. 12/28/15   [provider]  ?pseudoephedrine (SUDAFED 12 HOUR) 120 MG 12 hr tablet Take 1 tablet (120 mg total) by mouth  2 (two) times daily. 10/21/21   Rodriguez-Southworth, Nettie Elm, PA-C  ?rizatriptan (MAXALT-MLT) 10 MG disintegrating tablet Take 10 mg by mouth as needed for migraine. May repeat in 2 hours if needed    [provider]  ?sertraline (ZOLOFT) 25 MG tablet Take by mouth. 01/25/18 03/21/20  [provider]  ?sitaGLIPtin (JANUVIA) 100 MG tablet Take 100 mg by mouth daily. ?Patient not taking: Reported on 12/14/2020    [provider]  ?valsartan-hydrochlorothiazide (DIOVAN-HCT) 320-12.5 MG tablet Take 1 tablet by mouth daily.    [provider]  ?vitamin B-12 (CYANOCOBALAMIN) 1000 MCG tablet Take 1,000 mcg by mouth daily.    [provider]   ? ? ?Family History ?Family History  ?Problem Relation Age of Onset  ? Diabetes Mother   ? Cancer Mother   ? Diabetes Father   ? Throat cancer Father   ? ? ?Social History ?Social History  ? ?Tobacco Use  ? Smoking status: Never  ? Smokeless tobacco: Never  ?Substance Use Topics  ? Alcohol use: No  ? Drug use: Not Currently  ? ? ? ?Allergies   ?Codeine ? ? ?Review of Systems ?Review of Systems  ?Respiratory:  Positive for cough.   ?All other systems reviewed and are negative. ? ? ?Physical Exam ?Triage Vital Signs ?ED Triage Vitals  ?Enc Vitals Group  ?   BP 11/06/21 1726 121/79  ?   Pulse Rate 11/06/21 1726 95  ?   Resp 11/06/21 1726 16  ?   Temp 11/06/21 1726 97.9 ?F (36.6 ?C)  ?   Temp Source 11/06/21 1726 Oral  ?   SpO2 11/06/21 1726 100 %  ?   Weight --   ?   Height --   ?   Head Circumference --   ?   Peak Flow --   ?   Pain Score 11/06/21 1845 0  ?   Pain Loc --   ?   Pain Edu? --   ?   Excl. in GC? --   ? ?No data found. ? ?Updated Vital Signs ?BP 121/79 (BP Location: Left Arm)   Pulse 95   Temp 97.9 ?F (36.6 ?C) (Oral)   Resp 16   SpO2 100%  ? ?Visual Acuity ?Right Eye Distance:   ?Left Eye Distance:   ?Bilateral Distance:   ? ?Right Eye Near:   ?Left Eye Near:    ?Bilateral Near:    ? ?Physical Exam ?Vitals and nursing note reviewed.  ?Constitutional:   ?   Appearance: She is well-developed.  ?HENT:  ?   Head: Normocephalic.  ?   Right Ear: Tympanic membrane normal.  ?   Left Ear: Tympanic membrane normal.  ?   Mouth/Throat:  ?   Mouth: Mucous membranes are moist.  ?Eyes:  ?   Pupils: Pupils are equal, round, and reactive to light.  ?Cardiovascular:  ?   Rate and Rhythm: Normal rate and regular rhythm.  ?Pulmonary:  ?   Effort: Pulmonary effort is normal.  ?Abdominal:  ?   General: Abdomen is flat. There is no distension.  ?Musculoskeletal:     ?   General: Normal range of motion.  ?   Cervical back: Normal range of motion.  ?Skin: ?   General: Skin is warm.  ?Neurological:  ?   Mental Status: She is  alert and oriented to person, place, and time.  ?Psychiatric:     ?   Mood and Affect: Mood normal.  ? ? ? ?  UC Treatments / Results  ?Labs ?(all labs ordered are listed, but only abnormal results are displayed) ?Labs Reviewed - No data to display ? ?EKG ? ? ?Radiology ?DG Chest 2 View ? ?Result Date: 11/06/2021 ?CLINICAL DATA:  cough EXAM: CHEST - 2 VIEW COMPARISON:  Chest x-ray 04/07/2016 FINDINGS: The heart and mediastinal contours are within normal limits. No focal consolidation. No pulmonary edema. No pleural effusion. No pneumothorax. No acute osseous abnormality. Right upper quadrant surgical clips. IMPRESSION: No active cardiopulmonary disease. Electronically Signed   By: Tish FredericksonMorgane  Naveau M.D.   On: 11/06/2021 18:21   ? ?Procedures ?Procedures (including critical care time) ? ?Medications Ordered in UC ?Medications - No data to display ? ?Initial Impression / Assessment and Plan / UC Course  ?I have reviewed the triage vital signs and the nursing notes. ? ?Pertinent labs & imaging results that were available during my care of the patient were reviewed by me and considered in my medical decision making (see chart for details). ? ?  ? ?MDM chest x-ray shows no acute abnormality patient reports she is getting better since she has been on the prednisone and antibiotics I will extend patient's prednisone dose by 3 more days she is advised to finish the antibiotic and follow-up with her primary care physician if symptoms persist ?Final Clinical Impressions(s) / UC Diagnoses  ? ?Final diagnoses:  ?Acute bronchitis, unspecified organism  ? ? ? ?Discharge Instructions   ? ?  ?Continue taking antibiotics.   ? ? ?ED Prescriptions   ? ? Medication Sig Dispense Auth. Provider  ? predniSONE (DELTASONE) 50 MG tablet One tablet a day 3 tablet Elson AreasSofia, Macari Zalesky K, [place]PA-C  ? ?  ? ?PDMP not reviewed this encounter. ?An After Visit Summary was printed and given to the patient.  ?  ?Elson AreasSofia, Cayetano Mikita K, PA-C ?11/08/21 1600 ? ?

## 2021-12-17 ENCOUNTER — Encounter: Payer: Self-pay | Admitting: Obstetrics and Gynecology

## 2021-12-17 ENCOUNTER — Ambulatory Visit (INDEPENDENT_AMBULATORY_CARE_PROVIDER_SITE_OTHER): Payer: Federal, State, Local not specified - PPO | Admitting: Obstetrics and Gynecology

## 2021-12-17 DIAGNOSIS — Z01419 Encounter for gynecological examination (general) (routine) without abnormal findings: Secondary | ICD-10-CM

## 2021-12-17 DIAGNOSIS — Z1231 Encounter for screening mammogram for malignant neoplasm of breast: Secondary | ICD-10-CM | POA: Diagnosis not present

## 2021-12-17 DIAGNOSIS — R102 Pelvic and perineal pain: Secondary | ICD-10-CM

## 2021-12-17 DIAGNOSIS — Z1211 Encounter for screening for malignant neoplasm of colon: Secondary | ICD-10-CM

## 2021-12-17 NOTE — Progress Notes (Signed)
HPI: ?     [name] is a 61 y.o. [id number] who LMP was No LMP recorded. Patient has had an ablation. ? ?Subjective:  ? ?She presents today for her annual examination.  She states that she has begun having some pelvic discomfort especially on the right side.  She also states that she had 1 small episode of bleeding for 1 day about 2 months ago. ?She is on a new medication for her diabetes and she says it seems to cause yeast infections.  She is currently using antifungal cream vaginally. ?She has a history of uterine fibroids and endometrial ablation. ?She is now taking Zoloft for menopausal symptoms and says it is working. ?She is seeing an endocrinologist for glycemic control and he does her routine blood work. ? ?  Hx: ?The following portions of the patient's history were reviewed and updated as appropriate: ?            She  has a past medical history of Asthma, Diabetes mellitus without complication (HCC), Hiatal hernia, Hypercholesteremia, Hypertension, and Long COVID. ?She does not have any pertinent problems on file. ?She  has a past surgical history that includes Cholecystectomy. ?Her family history includes Cancer in her mother; Diabetes in her father and mother; Throat cancer in her father. ?She  reports that she has never smoked. She has never used smokeless tobacco. She reports that she does not currently use drugs. She reports that she does not drink alcohol. ?She has a current medication list which includes the following prescription(s): albuterol, aspirin, atorvastatin, baclofen, cholecalciferol, furosemide, ketorolac, magnesium oxide, metformin, methocarbamol, nortriptyline, ozempic (1 mg/dose), potassium chloride, pseudoephedrine, rizatriptan, sitagliptin, valsartan-hydrochlorothiazide, vitamin b-12, and sertraline. ?She is allergic to codeine. ?      ?Review of Systems:  ?Review of Systems ? ?Constitutional: Denied constitutional symptoms, night sweats, recent illness, fatigue, fever,  insomnia and weight loss.  ?Eyes: Denied eye symptoms, eye pain, photophobia, vision change and visual disturbance.  ?Ears/Nose/Throat/Neck: Denied ear, nose, throat or neck symptoms, hearing loss, nasal discharge, sinus congestion and sore throat.  ?Cardiovascular: Denied cardiovascular symptoms, arrhythmia, chest pain/pressure, edema, exercise intolerance, orthopnea and palpitations.  ?Respiratory: Denied pulmonary symptoms, asthma, pleuritic pain, productive sputum, cough, dyspnea and wheezing.  ?Gastrointestinal: Denied, gastro-esophageal reflux, melena, nausea and vomiting.  ?Genitourinary: See HPI for additional information.  ?Musculoskeletal: Denied musculoskeletal symptoms, stiffness, swelling, muscle weakness and myalgia.  ?Dermatologic: Denied dermatology symptoms, rash and scar.  ?Neurologic: Denied neurology symptoms, dizziness, headache, neck pain and syncope.  ?Psychiatric: Denied psychiatric symptoms, anxiety and depression.  ?Endocrine: See HPI for additional information.  ? ?Meds: ?  ?Current Outpatient Medications on File Prior to Visit  ?Medication Sig Dispense Refill  ? albuterol (PROVENTIL HFA;VENTOLIN HFA) 108 (90 Base) MCG/ACT inhaler Inhale 2 puffs into the lungs every 6 (six) hours as needed for wheezing or shortness of breath. 1 Inhaler 2  ? aspirin 81 MG chewable tablet Chew by mouth daily.    ? atorvastatin (LIPITOR) 40 MG tablet Take 40 mg by mouth daily.    ? baclofen (LIORESAL) 10 MG tablet Take 1 tablet (10 mg total) by mouth 3 (three) times daily. 30 each 0  ? cholecalciferol (VITAMIN D) 1000 units tablet Take 1,000 Units by mouth daily.    ? furosemide (LASIX) 20 MG tablet Take 20 mg by mouth every other day.    ? ketorolac (TORADOL) 10 MG tablet Take 1 tablet (10 mg total) by mouth every 6 (six) hours as needed. Prn headache  15 tablet 0  ? magnesium oxide (MAG-OX) 400 MG tablet Take 400 mg by mouth 3 (three) times daily.    ? metFORMIN (GLUCOPHAGE) 1000 MG tablet Take 1,000 mg by  mouth 2 (two) times daily with a meal.    ? methocarbamol (ROBAXIN) 750 MG tablet Take 750 mg by mouth 3 (three) times daily.    ? nortriptyline (PAMELOR) 10 MG capsule Take 20 mg by mouth at bedtime.    ? OZEMPIC, 1 MG/DOSE, 4 MG/3ML SOPN 2 mg.    ? potassium chloride (K-DUR) 10 MEQ tablet Take 1 tablet by mouth every other day.    ? pseudoephedrine (SUDAFED 12 HOUR) 120 MG 12 hr tablet Take 1 tablet (120 mg total) by mouth 2 (two) times daily. 30 tablet 0  ? rizatriptan (MAXALT-MLT) 10 MG disintegrating tablet Take 10 mg by mouth as needed for migraine. May repeat in 2 hours if needed    ? sitaGLIPtin (JANUVIA) 100 MG tablet Take 100 mg by mouth daily.    ? valsartan-hydrochlorothiazide (DIOVAN-HCT) 320-12.5 MG tablet Take 1 tablet by mouth daily.    ? vitamin B-12 (CYANOCOBALAMIN) 1000 MCG tablet Take 1,000 mcg by mouth daily.    ? sertraline (ZOLOFT) 25 MG tablet Take by mouth.    ? ?No current facility-administered medications on file prior to visit.  ? ? ? ?Objective:  ?  ? ?There were no vitals filed for this visit.  ?There were no vitals filed for this visit. ?  ?         Physical examination ?General NAD, Conversant  ?HEENT Atraumatic; Op clear with mmm.  Normo-cephalic. Pupils reactive. Anicteric sclerae  ?Thyroid/Neck Smooth without nodularity or enlargement. Normal ROM.  Neck Supple.  ?Skin No rashes, lesions or ulceration. Normal palpated skin turgor. No nodularity.  ?Breasts: No masses or discharge.  Symmetric.  No axillary adenopathy.  ?Lungs: Clear to auscultation.No rales or wheezes. Normal Respiratory effort, no retractions.  ?Heart: NSR.  No murmurs or rubs appreciated. No periferal edema  ?Abdomen: Soft.  Non-tender.  No masses.  No HSM. No hernia  ?Extremities: Moves all appropriately.  Normal ROM for age. No lymphadenopathy.  ?Neuro: Oriented to PPT.  Normal mood. Normal affect.  ? ?  Pelvic:   ?Vulva: Normal appearance.  No lesions.  ?Vagina: No lesions or abnormalities noted.  ?Support:  Normal pelvic support.  ?Urethra No masses tenderness or scarring.  ?Meatus Normal size without lesions or prolapse.  ?Cervix: Normal appearance.  No lesions.  ?Anus: Normal exam.  No lesions.  ?Perineum: Normal exam.  No lesions.  ?      Bimanual   ?Uterus: Normal size.  Some pain with palpation  ?Adnexae: No masses.  Non-tender to palpation.  Pain with palpation  ?Cul-de-sac: Negative for abnormality.  ? ? ? ?Assessment:  ?  ?G2P2002 ?Patient Active Problem List  ? Diagnosis Date Noted  ? Grief 04/08/2016  ? ?  ?1. Well woman exam with routine gynecological exam   ?2. Screening mammogram for breast cancer   ?3. Screening for colon cancer   ?4. Pelvic pain in female   ? ? Patient with new onset pelvic pain especially right.  Does not believe she has a UTI. ? Self-medicating for yeast infection. ? 1 episode of spotting in menopause. ? ? ?Plan:  ?  ?       ? 1.  Basic Screening Recommendations ?The basic screening recommendations for asymptomatic women were discussed with the patient during her visit.  The age-appropriate recommendations were discussed with her and the rational for the tests reviewed.  When I am informed by the patient that another primary care physician has previously obtained the age-appropriate tests and they are up-to-date, only outstanding tests are ordered and referrals given as necessary.  Abnormal results of tests will be discussed with her when all of her results are completed.  Routine preventative health maintenance measures emphasized: Exercise/Diet/Weight control, Tobacco Warnings, Alcohol/Substance use risks and Stress Management ?Mammogram ordered-Cologuard. ?2.  Patient to inform us if she has any further episodes of postmenopausal bleeding. ?3.  Ultrasound for pelvic pain and endometrial thickness ? ?Orders ?Orders Placed This Encounter  ?Procedures  ? MM DIGITAL SCREENING BILATERAL  ? US PELVIS (TRANSABDOMINAL ONLY)  ? US PELVIS TRANSVAGINAL NON-OB (TV ONLY)  ? Cologuard  ? ? No  orders of the defined types were placed in this encounter. ?    ?  ?  F/U ? No follow-ups on file. ? ?Elonda Husky, M.D. ?12/17/2021 ?12:05 PM ? ? ? ?

## 2021-12-17 NOTE — Progress Notes (Signed)
Patients presents for annual exam today. Patient states doing well over the past year, states hot flashes are much better since starting zoloft. She states spotting last month and states concerns regarding fibroids.  Patient is up to date on pap smear. Patient is due for mammogram, ordered. Cologuard discussed and ordered. Patients annual labs were declined at this time.  ?Patient states no other questions or concerns at this time.   ?

## 2021-12-25 ENCOUNTER — Other Ambulatory Visit: Payer: Federal, State, Local not specified - PPO

## 2022-01-02 LAB — COLOGUARD: COLOGUARD: NEGATIVE

## 2022-01-21 ENCOUNTER — Other Ambulatory Visit: Payer: Self-pay | Admitting: Obstetrics and Gynecology

## 2022-01-21 DIAGNOSIS — Z1231 Encounter for screening mammogram for malignant neoplasm of breast: Secondary | ICD-10-CM

## 2022-02-17 ENCOUNTER — Ambulatory Visit
Admission: RE | Admit: 2022-02-17 | Discharge: 2022-02-17 | Disposition: A | Discharge status: Home / Self Care | Payer: Federal, State, Local not specified - PPO | Source: Ambulatory Visit | Attending: Obstetrics and Gynecology | Admitting: Obstetrics and Gynecology

## 2022-02-17 DIAGNOSIS — Z1231 Encounter for screening mammogram for malignant neoplasm of breast: Secondary | ICD-10-CM

## 2022-02-19 ENCOUNTER — Inpatient Hospital Stay
Admission: RE | Admit: 2022-02-19 | Discharge: 2022-02-19 | Disposition: A | Discharge status: Home / Self Care | Payer: Self-pay | Source: Ambulatory Visit | Attending: *Deleted | Admitting: *Deleted

## 2022-02-19 ENCOUNTER — Other Ambulatory Visit: Payer: Self-pay | Admitting: *Deleted

## 2022-02-19 DIAGNOSIS — Z1231 Encounter for screening mammogram for malignant neoplasm of breast: Secondary | ICD-10-CM

## 2022-02-23 ENCOUNTER — Encounter: Payer: Self-pay | Admitting: Obstetrics and Gynecology

## 2022-03-04 ENCOUNTER — Ambulatory Visit (INDEPENDENT_AMBULATORY_CARE_PROVIDER_SITE_OTHER): Payer: Federal, State, Local not specified - PPO

## 2022-03-04 DIAGNOSIS — R102 Pelvic and perineal pain: Secondary | ICD-10-CM

## 2022-03-08 ENCOUNTER — Encounter: Payer: Self-pay | Admitting: Obstetrics and Gynecology

## 2022-03-10 NOTE — Progress Notes (Signed)
Kristine Gilbert: Please schedule her for a video visit to discuss her ultrasound and her pelvic pain.

## 2022-03-12 ENCOUNTER — Encounter: Payer: Federal, State, Local not specified - PPO | Admitting: Obstetrics and Gynecology

## 2022-03-25 ENCOUNTER — Encounter: Payer: Self-pay | Admitting: Obstetrics and Gynecology

## 2022-03-25 ENCOUNTER — Telehealth: Payer: Federal, State, Local not specified - PPO | Admitting: Obstetrics and Gynecology

## 2022-03-25 DIAGNOSIS — N950 Postmenopausal bleeding: Secondary | ICD-10-CM | POA: Diagnosis not present

## 2022-03-25 DIAGNOSIS — R102 Pelvic and perineal pain: Secondary | ICD-10-CM

## 2022-03-25 DIAGNOSIS — N952 Postmenopausal atrophic vaginitis: Secondary | ICD-10-CM | POA: Diagnosis not present

## 2022-03-25 MED ORDER — ESTRADIOL 0.1 MG/GM VA CREA: 0.2500 | TOPICAL_CREAM | Freq: Every day | VAGINAL | 0 refills | Status: DC

## 2022-03-25 NOTE — Progress Notes (Signed)
Virtual Visit via Video Note  I connected with Kristine Gilbert on 03/25/22 at  7:30 AM EDT by video and verified that I was speaking with the correct person using two identifiers.    [name] is a 61 y.o. G2P2002 who LMP was No LMP recorded. Patient has had an ablation. I discussed the limitations, risks, security and privacy concerns of performing an evaluation and management service by video and the availability of in person appointments. I also discussed with the patient that there may be a patient responsible charge related to this service. The patient expressed understanding and agreed to proceed.  Location of patient: Home  Patient gave explicit verbal consent for video visit:  YES  Location of provider:  [facility] office  Persons other than physician and patient involved in provider conference:  None   Subjective:   History of Present Illness:    She continues to have some pelvic pain especially on the right side.  It is intermittent.  She reports no further bleeding since that very small episode she had several months ago. She has a history of uterine fibroids and endometrial ablation. She is now taking Zoloft for menopausal symptoms and says it is working. Of significant note, she has had a previous stroke. She recently underwent an ultrasound for evaluation of her pelvic symptoms. She states that she has a new boyfriend and this may become significant as she has not been sexually active in more than 6 years.  Hx: The following portions of the patient's history were reviewed and updated as appropriate:             She  has a past medical history of Asthma, Diabetes mellitus without complication (HCC), Hiatal hernia, Hypercholesteremia, Hypertension, and Long COVID. She does not have any pertinent problems on file. She  has a past surgical history that includes Cholecystectomy. Her family history includes Cancer in her mother; Diabetes in her father and mother; Throat  cancer in her father. She  reports that she has never smoked. She has never used smokeless tobacco. She reports that she does not currently use drugs. She reports that she does not drink alcohol. She has a current medication list which includes the following prescription(s): albuterol, aspirin, atorvastatin, baclofen, cholecalciferol, furosemide, ketorolac, magnesium oxide, metformin, methocarbamol, nortriptyline, ozempic (1 mg/dose), potassium chloride, pseudoephedrine, rizatriptan, sertraline, sitagliptin, valsartan-hydrochlorothiazide, and cyanocobalamin. She is allergic to codeine.       Review of Systems:  Review of Systems  Constitutional: Denied constitutional symptoms, night sweats, recent illness, fatigue, fever, insomnia and weight loss.  Eyes: Denied eye symptoms, eye pain, photophobia, vision change and visual disturbance.  Ears/Nose/Throat/Neck: Denied ear, nose, throat or neck symptoms, hearing loss, nasal discharge, sinus congestion and sore throat.  Cardiovascular: Denied cardiovascular symptoms, arrhythmia, chest pain/pressure, edema, exercise intolerance, orthopnea and palpitations.  Respiratory: Denied pulmonary symptoms, asthma, pleuritic pain, productive sputum, cough, dyspnea and wheezing.  Gastrointestinal: Denied, gastro-esophageal reflux, melena, nausea and vomiting.  Genitourinary: See HPI for additional information.  Musculoskeletal: Denied musculoskeletal symptoms, stiffness, swelling, muscle weakness and myalgia.  Dermatologic: Denied dermatology symptoms, rash and scar.  Neurologic: Denied neurology symptoms, dizziness, headache, neck pain and syncope.  Psychiatric: Denied psychiatric symptoms, anxiety and depression.  Endocrine: Denied endocrine symptoms including hot flashes and night sweats.   Meds:   Current Outpatient Medications on File Prior to Visit  Medication Sig Dispense Refill   albuterol (PROVENTIL HFA;VENTOLIN HFA) 108 (90 Base) MCG/ACT inhaler  Inhale 2 puffs into the lungs  every 6 (six) hours as needed for wheezing or shortness of breath. 1 Inhaler 2   aspirin 81 MG chewable tablet Chew by mouth daily.     atorvastatin (LIPITOR) 40 MG tablet Take 40 mg by mouth daily.     baclofen (LIORESAL) 10 MG tablet Take 1 tablet (10 mg total) by mouth 3 (three) times daily. 30 each 0   cholecalciferol (VITAMIN D) 1000 units tablet Take 1,000 Units by mouth daily.     furosemide (LASIX) 20 MG tablet Take 20 mg by mouth every other day.     ketorolac (TORADOL) 10 MG tablet Take 1 tablet (10 mg total) by mouth every 6 (six) hours as needed. Prn headache 15 tablet 0   magnesium oxide (MAG-OX) 400 MG tablet Take 400 mg by mouth 3 (three) times daily.     metFORMIN (GLUCOPHAGE) 1000 MG tablet Take 1,000 mg by mouth 2 (two) times daily with a meal.     methocarbamol (ROBAXIN) 750 MG tablet Take 750 mg by mouth 3 (three) times daily.     nortriptyline (PAMELOR) 10 MG capsule Take 20 mg by mouth at bedtime.     OZEMPIC, 1 MG/DOSE, 4 MG/3ML SOPN 2 mg.     potassium chloride (K-DUR) 10 MEQ tablet Take 1 tablet by mouth every other day.     pseudoephedrine (SUDAFED 12 HOUR) 120 MG 12 hr tablet Take 1 tablet (120 mg total) by mouth 2 (two) times daily. 30 tablet 0   rizatriptan (MAXALT-MLT) 10 MG disintegrating tablet Take 10 mg by mouth as needed for migraine. May repeat in 2 hours if needed     sertraline (ZOLOFT) 25 MG tablet Take by mouth.     sitaGLIPtin (JANUVIA) 100 MG tablet Take 100 mg by mouth daily.     valsartan-hydrochlorothiazide (DIOVAN-HCT) 320-12.5 MG tablet Take 1 tablet by mouth daily.     vitamin B-12 (CYANOCOBALAMIN) 1000 MCG tablet Take 1,000 mcg by mouth daily.     No current facility-administered medications on file prior to visit.    Assessment:    G2P2002 Patient Active Problem List   Diagnosis Date Noted   Grief 04/08/2016     1. Pelvic pain in female   2. Postmenopausal bleeding   3. Vaginal atrophy     Her  postmenopausal bleeding seems to have resolved.  She continues to have intermittent pelvic pressure/pain.  She reports that the internal vaginal ultrasound was painful.  Although her recent ultrasound shows the possibility of adenomyosis I think this is not really her problem at this time because she is menopausal and should not be experiencing issues with adenomyosis.  The ultrasound shows no other pelvic abnormalities.  Her endometrium is very low. (History of endometrial ablation and menopause)  Plan:            1.  We will begin estrogen vaginal cream.  Patient instructed in its use.  I am hoping this helps with her pelvic pain and will certainly be better for her if she again becomes sexually active.  I have asked her to follow-up in 6 weeks. Orders No orders of the defined types were placed in this encounter.   No orders of the defined types were placed in this encounter.     F/U  Return in about 6 weeks (around 05/06/2022).  I spent 18 minutes involved in the care of this patient preparing to see the patient by obtaining and reviewing her medical history (including labs, imaging tests and prior  procedures), documenting clinical information in the electronic health record (EHR), counseling and coordinating care plans, writing and sending prescriptions, ordering tests or procedures and in direct communicating with the patient and medical staff discussing pertinent items from her history and physical exam.  Elonda Husky, M.D. 03/25/2022 8:07 AM

## 2022-05-30 ENCOUNTER — Ambulatory Visit (INDEPENDENT_AMBULATORY_CARE_PROVIDER_SITE_OTHER): Payer: Federal, State, Local not specified - PPO

## 2022-05-30 ENCOUNTER — Ambulatory Visit
Admission: RE | Admit: 2022-05-30 | Discharge: 2022-05-30 | Disposition: A | Discharge status: Home / Self Care | Payer: Federal, State, Local not specified - PPO | Source: Ambulatory Visit | Attending: Family Medicine | Admitting: Family Medicine

## 2022-05-30 VITALS — BP 155/97 | HR 97 | Temp 99.1°F | Ht 65.0 in | Wt 150.0 lb

## 2022-05-30 DIAGNOSIS — Z1152 Encounter for screening for COVID-19: Secondary | ICD-10-CM | POA: Diagnosis not present

## 2022-05-30 DIAGNOSIS — E119 Type 2 diabetes mellitus without complications: Secondary | ICD-10-CM | POA: Diagnosis not present

## 2022-05-30 DIAGNOSIS — J45909 Unspecified asthma, uncomplicated: Secondary | ICD-10-CM | POA: Diagnosis not present

## 2022-05-30 DIAGNOSIS — I10 Essential (primary) hypertension: Secondary | ICD-10-CM | POA: Insufficient documentation

## 2022-05-30 DIAGNOSIS — J988 Other specified respiratory disorders: Secondary | ICD-10-CM | POA: Diagnosis not present

## 2022-05-30 DIAGNOSIS — R509 Fever, unspecified: Secondary | ICD-10-CM | POA: Insufficient documentation

## 2022-05-30 DIAGNOSIS — Z79899 Other long term (current) drug therapy: Secondary | ICD-10-CM | POA: Diagnosis not present

## 2022-05-30 DIAGNOSIS — Z7952 Long term (current) use of systemic steroids: Secondary | ICD-10-CM | POA: Insufficient documentation

## 2022-05-30 DIAGNOSIS — B9789 Other viral agents as the cause of diseases classified elsewhere: Secondary | ICD-10-CM | POA: Diagnosis not present

## 2022-05-30 DIAGNOSIS — R519 Headache, unspecified: Secondary | ICD-10-CM | POA: Diagnosis not present

## 2022-05-30 DIAGNOSIS — R0602 Shortness of breath: Secondary | ICD-10-CM | POA: Diagnosis not present

## 2022-05-30 DIAGNOSIS — R059 Cough, unspecified: Secondary | ICD-10-CM | POA: Insufficient documentation

## 2022-05-30 LAB — RESP PANEL BY RT-PCR (FLU A&B, COVID) ARPGX2
Influenza A by PCR: NEGATIVE
Influenza B by PCR: NEGATIVE
SARS Coronavirus 2 by RT PCR: NEGATIVE

## 2022-05-30 MED ORDER — AZITHROMYCIN 250 MG PO TABS: 250.0000 mg | ORAL_TABLET | Freq: Every day | ORAL | 0 refills | Status: DC

## 2022-05-30 MED ORDER — PROMETHAZINE-DM 6.25-15 MG/5ML PO SYRP
5.0000 mL | ORAL_SOLUTION | Freq: Four times a day (QID) | ORAL | 0 refills | Status: DC | PRN
Start: 2022-05-30 — End: 2022-11-08

## 2022-05-30 MED ORDER — PREDNISONE 20 MG PO TABS: 40.0000 mg | ORAL_TABLET | Freq: Every day | ORAL | 0 refills | Status: AC

## 2022-05-30 MED ORDER — BENZONATATE 100 MG PO CAPS: 200.0000 mg | ORAL_CAPSULE | Freq: Three times a day (TID) | ORAL | 0 refills | Status: DC | PRN

## 2022-05-30 NOTE — Discharge Instructions (Signed)
Your chest x-ray did not show any pneumonia.  Your COVID, flu and RSV testing was negative.  Stop by the pharmacy to pick up your cough medicine and steroids.  You likely have a common upper respiratory virus.  Symptoms should not improve in the next week.   For your asthma flare: Use your albuterol inhaler every 4 hours for the next 2 days while you are awake.  Take your steroids once a day preferably in the morning.  Go to ED for red flag symptoms, including; fevers you cannot reduce with Tylenol/Motrin, severe headaches, vision changes, numbness/weakness in part of the body, lethargy, confusion, intractable vomiting, severe dehydration, chest pain, breathing difficulty, severe persistent abdominal or pelvic pain, signs of severe infection (increased redness, swelling of an area), feeling faint or passing out, dizziness, etc. You should especially go to the ED for sudden acute worsening of condition if you do not elect to go at this time.

## 2022-05-30 NOTE — ED Provider Notes (Signed)
[facility]    CSN: [id number] Arrival date & time: 05/30/22  1041      History   Chief Complaint Chief Complaint  Patient presents with   Cough   Generalized Body Aches    HPI Cristianna Cyr is a 61 y.o. female.   HPI   Saanvi presents for cough, myalgias, fatigue, headache, sinus pressure, nasal congestion, rhinorrhea, nausea and sleep disturbance for the past 4 days.  States that her grandson who is around 28 years old also has been sick.  Keeps him on a regular.  He has been taking over-the-counter cough and cold medicine every 4-6 hours without relief.  Feels warm and believes she has a fever.  Still been experiencing chills.  Does not have an appetite right now.  She is trying to stay hydrated.      Past Medical History:  Diagnosis Date   Asthma    Diabetes mellitus without complication (HCC)    Hiatal hernia    Hypercholesteremia    Hypertension    Long COVID     Patient Active Problem List   Diagnosis Date Noted   Grief 04/08/2016    Past Surgical History:  Procedure Laterality Date   CHOLECYSTECTOMY      OB History     Gravida  2   Para  2   Term  2   Preterm      AB      Living  2      SAB      IAB      Ectopic      Multiple      Live Births  2            Home Medications    Prior to Admission medications   Medication Sig Start Date End Date Taking? Authorizing Provider  benzonatate (TESSALON) 100 MG capsule Take 2 capsules (200 mg total) by mouth 3 (three) times daily as needed for cough. 05/30/22  Yes Raea Magallon, DO  predniSONE (DELTASONE) 20 MG tablet Take 2 tablets (40 mg total) by mouth daily for 5 days. 05/30/22 06/04/22 Yes Anecia Nusbaum, DO  promethazine-dextromethorphan (PROMETHAZINE-DM) 6.25-15 MG/5ML syrup Take 5 mLs by mouth 4 (four) times daily as needed. 05/30/22  Yes Aslan Montagna, DO  albuterol (PROVENTIL HFA;VENTOLIN HFA) 108 (90 Base) MCG/ACT inhaler Inhale 2 puffs into the  lungs every 6 (six) hours as needed for wheezing or shortness of breath. 04/08/16   Minna Antis, MD  aspirin 81 MG chewable tablet Chew by mouth daily.    [provider]  atorvastatin (LIPITOR) 40 MG tablet Take 40 mg by mouth daily.    [provider]  baclofen (LIORESAL) 10 MG tablet Take 1 tablet (10 mg total) by mouth 3 (three) times daily. 10/21/21   Rodriguez-Southworth, Nettie Elm, PA-C  cholecalciferol (VITAMIN D) 1000 units tablet Take 1,000 Units by mouth daily.    [provider]  estradiol (ESTRACE) 0.1 MG/GM vaginal cream Place 0.25 Applicatorfuls vaginally at bedtime. 03/25/22 03/25/23  Linzie Collin, MD  furosemide (LASIX) 20 MG tablet Take 20 mg by mouth every other day.    [provider]  ketorolac (TORADOL) 10 MG tablet Take 1 tablet (10 mg total) by mouth every 6 (six) hours as needed. Prn headache 10/21/21   Rodriguez-Southworth, Nettie Elm, PA-C  magnesium oxide (MAG-OX) 400 MG tablet Take 400 mg by mouth 3 (three) times daily.    [provider]  metFORMIN (GLUCOPHAGE) 1000 MG tablet Take  1,000 mg by mouth 2 (two) times daily with a meal.    [provider]  methocarbamol (ROBAXIN) 750 MG tablet Take 750 mg by mouth 3 (three) times daily.    [provider]  nortriptyline (PAMELOR) 10 MG capsule Take 20 mg by mouth at bedtime.    [provider]  OZEMPIC, 1 MG/DOSE, 4 MG/3ML SOPN 2 mg. 11/28/19   [provider]  potassium chloride (K-DUR) 10 MEQ tablet Take 1 tablet by mouth every other day. 12/28/15   [provider]  pseudoephedrine (SUDAFED 12 HOUR) 120 MG 12 hr tablet Take 1 tablet (120 mg total) by mouth 2 (two) times daily. 10/21/21   Rodriguez-Southworth, Nettie Elm, PA-C  rizatriptan (MAXALT-MLT) 10 MG disintegrating tablet Take 10 mg by mouth as needed for migraine. May repeat in 2 hours if needed    [provider]  sertraline (ZOLOFT) 25 MG tablet Take by mouth. 01/25/18  03/21/20  [provider]  sitaGLIPtin (JANUVIA) 100 MG tablet Take 100 mg by mouth daily.    [provider]  valsartan-hydrochlorothiazide (DIOVAN-HCT) 320-12.5 MG tablet Take 1 tablet by mouth daily.    [provider]  vitamin B-12 (CYANOCOBALAMIN) 1000 MCG tablet Take 1,000 mcg by mouth daily.    [provider]    Family History Family History  Problem Relation Age of Onset   Diabetes Mother    Cancer Mother    Diabetes Father    Throat cancer Father     Social History Social History   Tobacco Use   Smoking status: Never   Smokeless tobacco: Never  Substance Use Topics   Alcohol use: No   Drug use: Not Currently     Allergies   Codeine   Review of Systems Review of Systems: negative unless otherwise stated in HPI.      Physical Exam Triage Vital Signs ED Triage Vitals  Enc Vitals Group     BP 05/30/22 1053 (!) 155/97     Pulse Rate 05/30/22 1053 97     Resp --      Temp 05/30/22 1053 99.1 F (37.3 C)     Temp Source 05/30/22 1053 Oral     SpO2 05/30/22 1053 95 %     Weight 05/30/22 1052 150 lb (68 kg)     Height 05/30/22 1052 5\' 5"  (1.651 m)     Head Circumference --      Peak Flow --      Pain Score 05/30/22 1052 8     Pain Loc --      Pain Edu? --      Excl. in [place]? --    No data found.  Updated Vital Signs BP (!) 155/97 (BP Location: Right Arm)   Pulse 97   Temp 99.1 F (37.3 C) (Oral)   Ht 5\' 5"  (1.651 m)   Wt 68 kg   SpO2 95%   BMI 24.96 kg/m   Visual Acuity Right Eye Distance:   Left Eye Distance:   Bilateral Distance:    Right Eye Near:   Left Eye Near:    Bilateral Near:     Physical Exam GEN:     alert, non-toxic appearing female in no distress     HENT:  mucus membranes moist, oropharyngeal  without lesions or  exudate, no  tonsillar hypertrophy,   mild oropharyngeal erythema ,   moderate erythematous edematous turbinates,  clear nasal discharge,  bilateral TM  normal EYES:   pupils  equal and reactive, EOMi ,  no scleral injection NECK:  normal ROM, anterior lymphadenopathy,  no meningismus   RESP:  no increased work of breathing, faint expiratory wheezing, no rhonchi, no rales CVS:   regular rate and rhythm Skin:   warm and dry, no rash on visible skin    UC Treatments / Results  Labs (all labs ordered are listed, but only abnormal results are displayed) Labs Reviewed  RESP PANEL BY RT-PCR (FLU A&B, COVID) [id number]    EKG   Radiology DG Chest 2 View  Result Date: 05/30/2022 CLINICAL DATA:  61 year old female presenting for evaluation of shortness of breath and fever with headache and body aches. EXAM: CHEST - 2 VIEW COMPARISON:  November 06, 2021 FINDINGS: The heart size and mediastinal contours are within normal limits. Both lungs are clear. The visualized skeletal structures are unremarkable. IMPRESSION: No active cardiopulmonary disease. Electronically Signed   By: Zetta Bills M.D.   On: 05/30/2022 11:48    Procedures Procedures (including critical care time)  Medications Ordered in UC Medications - No data to display  Initial Impression / Assessment and Plan / UC Course  I have reviewed the triage vital signs and the nursing notes.  Pertinent labs & imaging results that were available during my care of the patient were reviewed by me and considered in my medical decision making (see chart for details).       Pt is a 61 y.o. female with history of asthma, hypertension, type 2 diabetes who presents for 4 days of respiratory symptoms. Elayna has an elevated temperature of 99.1 F here but with the recent use of antipyretics.  Satting 95% at rest but drops to 92% with conversation.  Pulmonary exam  is remarkable for an expiratory wheezing.  Obtain CXR. COVID and influenza testing obtained. Lab reports RSV was also negative.   Chest xray personally reviewed by me without focal pneumonia, pleural effusion, cardiomegaly or pneumothorax. History consistent  with viral respiratory illness. Discussed symptomatic treatment.  Explained lack of efficacy of antibiotics in viral disease.  Typical duration of symptoms discussed.  Given steroid burst for asthma flare.  Return and ED precautions given and patient voiced understanding.  Discussed MDM, treatment plan and plan for follow-up with patient who agrees with plan.     Final Clinical Impressions(s) / UC Diagnoses   Final diagnoses:  None     Discharge Instructions      Your chest x-ray did not show any pneumonia.  Your COVID, flu and RSV testing was negative.  Stop by the pharmacy to pick up your cough medicine and steroids.  You likely have a common upper respiratory virus.  Symptoms should not improve in the next week.   For your asthma flare: Use your albuterol inhaler every 4 hours for the next 2 days while you are awake.  Take your steroids once a day preferably in the morning.  Go to ED for red flag symptoms, including; fevers you cannot reduce with Tylenol/Motrin, severe headaches, vision changes, numbness/weakness in part of the body, lethargy, confusion, intractable vomiting, severe dehydration, chest pain, breathing difficulty, severe persistent abdominal or pelvic pain, signs of severe infection (increased redness, swelling of an area), feeling faint or passing out, dizziness, etc. You should especially go to the ED for sudden acute worsening of condition if you do not elect to go at this time.       ED Prescriptions     Medication Sig Dispense Auth. Provider  promethazine-dextromethorphan (PROMETHAZINE-DM) 6.25-15 MG/5ML syrup Take 5 mLs by mouth 4 (four) times daily as needed. 118 mL Atsushi Yom, DO   benzonatate (TESSALON) 100 MG capsule Take 2 capsules (200 mg total) by mouth 3 (three) times daily as needed for cough. 21 capsule Elke Holtry, DO   predniSONE (DELTASONE) 20 MG tablet Take 2 tablets (40 mg total) by mouth daily for 5 days. 10 tablet Katha Cabal, DO       PDMP not reviewed this encounter.   Katha Cabal, DO 05/30/22 1220

## 2022-05-30 NOTE — ED Triage Notes (Signed)
Pt c/o headache, body aches, weakness, onset x4 days ago

## 2022-09-10 ENCOUNTER — Other Ambulatory Visit: Payer: Self-pay | Admitting: Obstetrics and Gynecology

## 2022-09-10 DIAGNOSIS — R102 Pelvic and perineal pain: Secondary | ICD-10-CM

## 2022-09-10 DIAGNOSIS — N952 Postmenopausal atrophic vaginitis: Secondary | ICD-10-CM

## 2022-09-10 DIAGNOSIS — N950 Postmenopausal bleeding: Secondary | ICD-10-CM

## 2022-09-11 NOTE — Telephone Encounter (Signed)
One additional refill sent in, patient made aware to schedule a follow-up for future refills via MyChart.

## 2022-11-08 ENCOUNTER — Encounter: Payer: Self-pay | Admitting: Emergency Medicine

## 2022-11-08 ENCOUNTER — Ambulatory Visit
Admission: EM | Admit: 2022-11-08 | Discharge: 2022-11-08 | Disposition: A | Discharge status: Home / Self Care | Payer: Federal, State, Local not specified - PPO | Attending: Family Medicine | Admitting: Family Medicine

## 2022-11-08 ENCOUNTER — Ambulatory Visit (INDEPENDENT_AMBULATORY_CARE_PROVIDER_SITE_OTHER): Payer: Federal, State, Local not specified - PPO

## 2022-11-08 DIAGNOSIS — U099 Post covid-19 condition, unspecified: Secondary | ICD-10-CM | POA: Insufficient documentation

## 2022-11-08 DIAGNOSIS — J45901 Unspecified asthma with (acute) exacerbation: Secondary | ICD-10-CM | POA: Diagnosis present

## 2022-11-08 DIAGNOSIS — B3731 Acute candidiasis of vulva and vagina: Secondary | ICD-10-CM | POA: Insufficient documentation

## 2022-11-08 DIAGNOSIS — N898 Other specified noninflammatory disorders of vagina: Secondary | ICD-10-CM | POA: Insufficient documentation

## 2022-11-08 DIAGNOSIS — Z792 Long term (current) use of antibiotics: Secondary | ICD-10-CM | POA: Diagnosis present

## 2022-11-08 LAB — URINALYSIS, W/ REFLEX TO CULTURE (INFECTION SUSPECTED)
Bilirubin Urine: NEGATIVE
Glucose, UA: 500 mg/dL — AB
Hgb urine dipstick: NEGATIVE
Ketones, ur: NEGATIVE mg/dL
Leukocytes,Ua: NEGATIVE
Nitrite: NEGATIVE
Protein, ur: NEGATIVE mg/dL
Specific Gravity, Urine: 1.02 (ref 1.005–1.030)
pH: 6.5 (ref 5.0–8.0)

## 2022-11-08 LAB — WET PREP, GENITAL
Clue Cells Wet Prep HPF POC: NONE SEEN
Sperm: NONE SEEN
Trich, Wet Prep: NONE SEEN
WBC, Wet Prep HPF POC: <10 (ref <10)

## 2022-11-08 MED ORDER — PROMETHAZINE-DM 6.25-15 MG/5ML PO SYRP: 5.0000 mL | ORAL_SOLUTION | Freq: Four times a day (QID) | ORAL | 0 refills | Status: DC | PRN

## 2022-11-08 MED ORDER — PREDNISONE 20 MG PO TABS: 40.0000 mg | ORAL_TABLET | Freq: Every day | ORAL | 0 refills | Status: AC

## 2022-11-08 MED ORDER — FLUCONAZOLE 150 MG PO TABS: ORAL_TABLET | ORAL | 0 refills | Status: DC

## 2022-11-08 NOTE — ED Triage Notes (Signed)
Patient c/o cough, congestion and chest pain that started over a week ago.  Patient states that she does have asthma.  Patient reports some SOB.  Patient reports coughing up yellow sputum.  Patient reports fever, chills and bodyaces.  Patient also reports vaginal discharge that started a week ago.

## 2022-11-08 NOTE — Discharge Instructions (Addendum)
-  Your x-ray is normal. Likely  having flare up of asthma. Use inhalers and continue azithromycin.  -I sent prednisone. This can elevate your blood sugar so keep an eye on that. Increase fluids and continue diabetes medicines. -You have a yeast infection. This is likely in part to the diabetes and also  likely related to chronic antibiotic use. You might have this frequently so it is worth discussing with your PCP. -No UTI.

## 2022-11-08 NOTE — ED Provider Notes (Signed)
[facility]    CSN: [id number] Arrival date & time: 11/08/22  1014      History   Chief Complaint Chief Complaint  Patient presents with   Vaginal Discharge   Cough   Chest Pain    HPI Kristine Gilbert is a 62 y.o. female presenting for ~1 week history of cough, congestion, chest tightness.  She says she has felt feverish at times and had chills and body aches. Reporting sputum productive of yellowish sputum. Reporting increased SOB. No known sick contacts or exposure to [place], flu.Has been using Trelegy and albuterol.   Patient also reporting vaginal discharge x 1 week. Believes she has a yeast infection.  Medical history significant for asthma, long haul COVID, diabetes, hypertension, and hyperlipidemia. Chronically takes azithromycin daily as prescribed by pulmonology. Started on this in the past 1 month.  HPI  Past Medical History:  Diagnosis Date   Asthma    Diabetes mellitus without complication ([place])    Hiatal hernia    Hypercholesteremia    Hypertension    Long COVID     Patient Active Problem List   Diagnosis Date Noted   Grief 04/08/2016    Past Surgical History:  Procedure Laterality Date   CHOLECYSTECTOMY      OB History     Gravida  2   Para  2   Term  2   Preterm      AB      Living  2      SAB      IAB      Ectopic      Multiple      Live Births  2            Home Medications    Prior to Admission medications   Medication Sig Start Date End Date Taking? Authorizing Provider  fluconazole (DIFLUCAN) 150 MG tablet Take 1 tab po q72h prn yeast infection 11/08/22  Yes Laurene Footman B, PA-C  predniSONE (DELTASONE) 20 MG tablet Take 2 tablets (40 mg total) by mouth daily for 5 days. 11/08/22 11/13/22 Yes Laurene Footman B, PA-C  albuterol (PROVENTIL HFA;VENTOLIN HFA) 108 (90 Base) MCG/ACT inhaler Inhale 2 puffs into the lungs every 6 (six) hours as needed for wheezing or shortness of breath. 04/08/16   Harvest Dark, MD  aspirin 81 MG chewable tablet Chew by mouth daily.    [provider]  atorvastatin (LIPITOR) 40 MG tablet Take 40 mg by mouth daily.    [provider]  azithromycin (ZITHROMAX Z-PAK) 250 MG tablet Take 1 tablet (250 mg total) by mouth daily. Take 2 tablets on day 1. Then 1 tablet daily. 05/30/22   Brimage, Ronnette Juniper, DO  baclofen (LIORESAL) 10 MG tablet Take 1 tablet (10 mg total) by mouth 3 (three) times daily. 10/21/21   Rodriguez-Southworth, Sunday Spillers, PA-C  benzonatate (TESSALON) 100 MG capsule Take 2 capsules (200 mg total) by mouth 3 (three) times daily as needed for cough. 05/30/22   Lyndee Hensen, DO  cholecalciferol (VITAMIN D) 1000 units tablet Take 1,000 Units by mouth daily.    [provider]  estradiol (ESTRACE) 0.1 MG/GM vaginal cream PLACE [id number] APPLICATORFUL VAGINALLY AT BEDTIME 09/11/22   Harlin Heys, MD  furosemide (LASIX) 20 MG tablet Take 20 mg by mouth every other day.    [provider]  ketorolac (TORADOL) 10 MG tablet Take 1 tablet (10 mg total) by mouth every 6 (six) hours as needed. Prn headache 10/21/21  Rodriguez-Southworth, Sunday Spillers, PA-C  magnesium oxide (MAG-OX) 400 MG tablet Take 400 mg by mouth 3 (three) times daily.    [provider]  metFORMIN (GLUCOPHAGE) 1000 MG tablet Take 1,000 mg by mouth 2 (two) times daily with a meal.    [provider]  methocarbamol (ROBAXIN) 750 MG tablet Take 750 mg by mouth 3 (three) times daily.    [provider]  nortriptyline (PAMELOR) 10 MG capsule Take 20 mg by mouth at bedtime.    [provider]  OZEMPIC, 1 MG/DOSE, 4 MG/3ML SOPN 2 mg. 11/28/19   [provider]  potassium chloride (K-DUR) 10 MEQ tablet Take 1 tablet by mouth every other day. 12/28/15   [provider]  promethazine-dextromethorphan (PROMETHAZINE-DM) 6.25-15 MG/5ML syrup Take 5 mLs by mouth 4 (four) times daily as needed. 05/30/22   Lyndee Hensen, DO   pseudoephedrine (SUDAFED 12 HOUR) 120 MG 12 hr tablet Take 1 tablet (120 mg total) by mouth 2 (two) times daily. 10/21/21   Rodriguez-Southworth, Sunday Spillers, PA-C  rizatriptan (MAXALT-MLT) 10 MG disintegrating tablet Take 10 mg by mouth as needed for migraine. May repeat in 2 hours if needed    [provider]  sertraline (ZOLOFT) 25 MG tablet Take by mouth. 01/25/18 03/21/20  [provider]  sitaGLIPtin (JANUVIA) 100 MG tablet Take 100 mg by mouth daily.    [provider]  valsartan-hydrochlorothiazide (DIOVAN-HCT) 320-12.5 MG tablet Take 1 tablet by mouth daily.    [provider]  vitamin B-12 (CYANOCOBALAMIN) 1000 MCG tablet Take 1,000 mcg by mouth daily.    [provider]    Family History Family History  Problem Relation Age of Onset   Diabetes Mother    Cancer Mother    Diabetes Father    Throat cancer Father     Social History Social History   Tobacco Use   Smoking status: Never   Smokeless tobacco: Never  Vaping Use   Vaping Use: Never used  Substance Use Topics   Alcohol use: No   Drug use: Not Currently     Allergies   Codeine   Review of Systems Review of Systems  Constitutional:  Positive for chills and fatigue. Negative for diaphoresis and fever.  HENT:  Positive for congestion and rhinorrhea. Negative for ear pain, sinus pressure, sinus pain and sore throat.   Respiratory:  Positive for cough, chest tightness, shortness of breath and wheezing.   Cardiovascular:  Negative for chest pain.  Gastrointestinal:  Negative for abdominal pain, nausea and vomiting.  Genitourinary:  Positive for vaginal discharge. Negative for dysuria and vaginal pain.  Musculoskeletal:  Negative for arthralgias and myalgias.  Skin:  Negative for rash.  Neurological:  Negative for weakness and headaches.  Hematological:  Negative for adenopathy.     Physical Exam Triage Vital Signs ED Triage Vitals  Enc Vitals Group     BP       Pulse      Resp      Temp      Temp src      SpO2      Weight      Height      Head Circumference      Peak Flow      Pain Score      Pain Loc      Pain Edu?      Excl. in [place]?    No data found.  Updated Vital Signs BP 126/81 (BP Location: Right Arm)  Pulse 91   Temp 98.2 F (36.8 C) (Oral)   Resp 14   Ht 5\' 5"  (1.651 m)   Wt 155 lb (70.3 kg)   SpO2 99%   BMI 25.79 kg/m      Physical Exam Vitals and nursing note reviewed.  Constitutional:      General: She is not in acute distress.    Appearance: Normal appearance. She is not ill-appearing or toxic-appearing.  HENT:     Head: Normocephalic and atraumatic.     Right Ear: Tympanic membrane, ear canal and external ear normal.     Left Ear: Tympanic membrane, ear canal and external ear normal.     Nose: Congestion present.     Mouth/Throat:     Mouth: Mucous membranes are moist.     Pharynx: Oropharynx is clear.  Eyes:     General: No scleral icterus.       Right eye: No discharge.        Left eye: No discharge.     Conjunctiva/sclera: Conjunctivae normal.  Cardiovascular:     Rate and Rhythm: Normal rate and regular rhythm.     Heart sounds: Normal heart sounds.  Pulmonary:     Effort: Pulmonary effort is normal. No respiratory distress.     Breath sounds: Wheezing (few scattered wheezes) present. No rhonchi or rales.  Musculoskeletal:     Cervical back: Neck supple.  Skin:    General: Skin is dry.  Neurological:     General: No focal deficit present.     Mental Status: She is alert. Mental status is at baseline.     Motor: No weakness.     Gait: Gait normal.  Psychiatric:        Mood and Affect: Mood normal.        Behavior: Behavior normal.        Thought Content: Thought content normal.      UC Treatments / Results  Labs (all labs ordered are listed, but only abnormal results are displayed) Labs Reviewed  WET PREP, GENITAL - Abnormal; Notable for the following components:      Result Value    Yeast Wet Prep HPF POC PRESENT (*)    All other components within normal limits  URINALYSIS, W/ REFLEX TO CULTURE (INFECTION SUSPECTED) - Abnormal; Notable for the following components:   Glucose, UA 500 (*)    Bacteria, UA RARE (*)    All other components within normal limits  CERVICOVAGINAL ANCILLARY ONLY    EKG   Radiology DG Chest 2 View  Result Date: 11/08/2022 CLINICAL DATA:  cough and congestion x 1 week. hx asthma and long covid EXAM: CHEST - 2 VIEW COMPARISON:  05/30/2022 FINDINGS: Lungs are clear. Heart size and mediastinal contours are within normal limits. No effusion. Visualized bones unremarkable.  Cholecystectomy clips. IMPRESSION: No acute cardiopulmonary disease. Electronically Signed   By: Lucrezia Europe M.D.   On: 11/08/2022 11:15    Procedures Procedures (including critical care time)  Medications Ordered in UC Medications - No data to display  Initial Impression / Assessment and Plan / UC Course  I have reviewed the triage vital signs and the nursing notes.  Pertinent labs & imaging results that were available during my care of the patient were reviewed by me and considered in my medical decision making (see chart for details).   62 y/o female with history of long haul COVID, DM, HTN, HLD, and asthma presents for 1 week history of chills,  body aches, cough, congestion, chest tightness. Also reporting vaginal discharge.   Vitals are all stable and patient without any signs of distress.  Few scattered wheezes throughout chest and nasal congestion.  CXR ordered to assess for pneumonia.  Normal x-ray.  UA obtained as well as wet prep and vaginal swab for GC/chlamydia. Self obtained vaginal swabs.  Vaginal swab positive for yeast.  No evidence of UTI.  Treating yeast vaginitis with Diflucan.  Likely related to diabetes and chronic antibiotic use.  Encouraged her to continue with her asthma medications and antibiotics.  Sent prednisone to pharmacy as she is likely  having exacerbation of asthma and Promethazine DM.  Advised to follow-up with pulmonology.   Final Clinical Impressions(s) / UC Diagnoses   Final diagnoses:  Vaginal yeast infection  Long term (current) use of antibiotics  Long COVID  Asthma with acute exacerbation, unspecified asthma severity, unspecified whether persistent  Vaginal discharge     Discharge Instructions      -Your x-ray is normal. Likely  having flare up of asthma. Use inhalers and continue azithromycin.  -I sent prednisone. This can elevate your blood sugar so keep an eye on that. Increase fluids and continue diabetes medicines. -You have a yeast infection. This is likely in part to the diabetes and also  likely related to chronic antibiotic use. You might have this frequently so it is worth discussing with your PCP. -No UTI.     ED Prescriptions     Medication Sig Dispense Auth. Provider   predniSONE (DELTASONE) 20 MG tablet Take 2 tablets (40 mg total) by mouth daily for 5 days. 10 tablet Danton Clap, PA-C   fluconazole (DIFLUCAN) 150 MG tablet Take 1 tab po q72h prn yeast infection 3 tablet Danton Clap, PA-C      PDMP not reviewed this encounter.   Danton Clap, PA-C 11/08/22 1151

## 2022-11-10 LAB — CERVICOVAGINAL ANCILLARY ONLY
Chlamydia: NEGATIVE
Comment: NEGATIVE
Comment: NORMAL
Neisseria Gonorrhea: NEGATIVE

## 2022-11-27 ENCOUNTER — Ambulatory Visit (INDEPENDENT_AMBULATORY_CARE_PROVIDER_SITE_OTHER): Payer: Federal, State, Local not specified - PPO

## 2022-11-27 ENCOUNTER — Other Ambulatory Visit (HOSPITAL_COMMUNITY)
Admission: RE | Admit: 2022-11-27 | Discharge: 2022-11-27 | Disposition: A | Discharge status: Home / Self Care | Payer: Federal, State, Local not specified - PPO | Source: Ambulatory Visit | Attending: Obstetrics and Gynecology | Admitting: Obstetrics and Gynecology

## 2022-11-27 VITALS — BP 127/85 | HR 93 | Ht 65.0 in | Wt 163.0 lb

## 2022-11-27 DIAGNOSIS — R300 Dysuria: Secondary | ICD-10-CM | POA: Diagnosis not present

## 2022-11-27 DIAGNOSIS — N898 Other specified noninflammatory disorders of vagina: Secondary | ICD-10-CM | POA: Insufficient documentation

## 2022-11-27 DIAGNOSIS — M545 Low back pain, unspecified: Secondary | ICD-10-CM

## 2022-11-27 LAB — POCT URINALYSIS DIPSTICK
Bilirubin, UA: NEGATIVE
Glucose, UA: NEGATIVE
Ketones, UA: NEGATIVE
Leukocytes, UA: NEGATIVE
Nitrite, UA: NEGATIVE
Protein, UA: NEGATIVE
Spec Grav, UA: 1.01 (ref 1.010–1.025)
Urobilinogen, UA: 1 E.U./dL
pH, UA: 6.5 (ref 5.0–8.0)

## 2022-11-27 NOTE — Progress Notes (Signed)
    NURSE VISIT NOTE  Subjective:    Patient ID: Kristine Gilbert, female    DOB: 08-29-1960, 62 y.o.   MRN: [id number]  HPI  Patient is a 62 y.o. [id number] female who presents for clear and white vaginal discharge and itching, lower back pain and dysuria for 4 day(s). Denies abnormal vaginal bleeding or significant pelvic pain or fever. admits to dysuria. Patient denies history of known exposure to STD.   Objective:    BP 127/85   Pulse 93   Ht  (1.651 m)   Wt 163 lb (73.9 kg)   BMI 27.12 kg/m     VISIT ONLY@  Assessment:   1. Vaginal discharge   2. Vaginal itching   3. Dysuria       Plan:  Urine culture sent to lab Yeast  and BV  DNA  probe sent to lab. Treatment: abstain from coitus during course of treatment. ROV prn if symptoms persist or worsen.   Cornelius Moras, CMA

## 2022-11-27 NOTE — Patient Instructions (Addendum)
Bacterial Vaginosis  Bacterial vaginosis is an infection of the vagina. It happens when too many normal germs (healthy bacteria) grow in the vagina. This infection can make it easier to get other infections from sex (STIs). It is very important for pregnant women to get treated. This infection can cause babies to be born early or at a low birth weight. What are the causes? This infection is caused by an increase in certain germs that grow in the vagina. You cannot get this infection from toilet seats, bedsheets, swimming pools, or things that touch your vagina. What increases the risk? Having sex with a new person or more than one person. Having sex without protection. Douching. Having an intrauterine device (IUD). Smoking. Using drugs or drinking alcohol. These can lead you to do things that are risky. Taking certain antibiotic medicines. Being pregnant. What are the signs or symptoms? Some women have no symptoms. Symptoms may include: A discharge from your vagina. It may be gray or white. It can be watery or foamy. A fishy smell. This can happen after sex or during your menstrual period. Itching in and around your vagina. A feeling of burning or pain when you pee (urinate). How is this treated? This infection is treated with antibiotic medicines. These may be given to you as: A pill. A cream for your vagina. A medicine that you put into your vagina (suppository). If the infection comes back after treatment, you may need more antibiotics. Follow these instructions at home: Medicines Take over-the-counter and prescription medicines as told by your doctor. Take or use your antibiotic medicine as told by your doctor. Do not stop taking or using it, even if you start to feel better. General instructions If the person you have sex with is a woman, tell her that you have this infection. She will need to follow up with her doctor. If you have a female partner, he does not need to be  treated. Do not have sex until you finish treatment. Drink enough fluid to keep your pee pale yellow. Keep your vagina and butt clean. Wash the area with warm water each day. Wipe from front to back after you use the toilet. If you are breastfeeding a baby, ask your doctor if you should keep doing so during treatment. Keep all follow-up visits. How is this prevented? Self-care Do not douche. Use only warm water to wash around your vagina. Wear underwear that is cotton or lined with cotton. Do not wear tight pants and pantyhose, especially in the summer. Safe sex Use protection when you have sex. This includes: Use condoms. Use dental dams. This is a thin layer that protects the mouth during oral sex. Limit how many people you have sex with. To prevent this infection, it is best to have sex with just one person. Get tested for STIs. The person you have sex with should also get tested. Drugs and alcohol Do not smoke or use any products that contain nicotine or tobacco. If you need help quitting, ask your doctor. Do not use drugs. Do not drink alcohol if: Your doctor tells you not to drink. You are pregnant, may be pregnant, or are planning to become pregnant. If you drink alcohol: Limit how much you have to 0-1 drink a day. Know how much alcohol is in your drink. In the [place] one drink equals one 12 oz bottle of beer (355 mL), one 5 oz glass of wine (148 mL), or one 1 oz glass of hard liquor (44   mL). Where to find more information Centers for Disease Control and Prevention: www.cdc.gov American Sexual Health Association: www.ashastd.org Office on Women's Health: www.womenshealth.gov Contact a doctor if: Your symptoms do not get better, even after you are treated. You have more discharge or pain when you pee. You have a fever or chills. You have pain in your belly (abdomen) or in the area between your hips. You have pain with sex. You bleed from your vagina between menstrual  periods. Summary This infection can happen when too many germs (bacteria) grow in the vagina. This infection can make it easier to get infections from sex (STIs). Treating this can lower that chance. Get treated if you are pregnant. This infection can cause babies to be born early. Do not stop taking or using your antibiotic medicine, even if you start to feel better. This information is not intended to replace advice given to you by your health care provider. Make sure you discuss any questions you have with your health care provider. Document Revised: 01/26/2020 Document Reviewed: 01/26/2020 Elsevier Patient Education  2023 Elsevier Inc. Vaginal Yeast Infection, Adult  Vaginal yeast infection is a condition that causes vaginal discharge as well as soreness, swelling, and redness (inflammation) of the vagina. This is a common condition. Some women get this infection frequently. What are the causes? This condition is caused by a change in the normal balance of the yeast (Candida) and normal bacteria that live in the vagina. This change causes an overgrowth of yeast, which causes the inflammation. What increases the risk? The condition is more likely to develop in women who: Take antibiotic medicines. Have diabetes. Take birth control pills. Are pregnant. Douche often. Have a weak body defense system (immune system). Have been taking steroid medicines for a long time. Frequently wear tight clothing. What are the signs or symptoms? Symptoms of this condition include: White, thick, creamy vaginal discharge. Swelling, itching, redness, and irritation of the vagina. The lips of the vagina (labia) may be affected as well. Pain or a burning feeling while urinating. Pain during sex. How is this diagnosed? This condition is diagnosed based on: Your medical history. A physical exam. A pelvic exam. Your health care provider will examine a sample of your vaginal discharge under a microscope.  Your health care provider may send this sample for testing to confirm the diagnosis. How is this treated? This condition is treated with medicine. Medicines may be over-the-counter or prescription. You may be told to use one or more of the following: Medicine that is taken by mouth (orally). Medicine that is applied as a cream (topically). Medicine that is inserted directly into the vagina (suppository). Follow these instructions at home: Take or apply over-the-counter and prescription medicines only as told by your health care provider. Do not use tampons until your health care provider approves. Do not have sex until your infection has cleared. Sex can prolong or worsen your symptoms of infection. Ask your health care provider when it is safe to resume sexual activity. Keep all follow-up visits. This is important. How is this prevented?  Do not wear tight clothes, such as pantyhose or tight pants. Wear breathable cotton underwear. Do not use douches, perfumed soap, creams, or powders. Wipe from front to back after using the toilet. If you have diabetes, keep your blood sugar levels under control. Ask your health care provider for other ways to prevent yeast infections. Contact a health care provider if: You have a fever. Your symptoms go away and then   return. Your symptoms do not get better with treatment. Your symptoms get worse. You have new symptoms. You develop blisters in or around your vagina. You have blood coming from your vagina and it is not your menstrual period. You develop pain in your abdomen. Summary Vaginal yeast infection is a condition that causes discharge as well as soreness, swelling, and redness (inflammation) of the vagina. This condition is treated with medicine. Medicines may be over-the-counter or prescription. Take or apply over-the-counter and prescription medicines only as told by your health care provider. Do not douche. Resume sexual activity or use of  tampons as instructed by your health care provider. Contact a health care provider if your symptoms do not get better with treatment or your symptoms go away and then return. This information is not intended to replace advice given to you by your health care provider. Make sure you discuss any questions you have with your health care provider. Document Revised: 10/15/2020 Document Reviewed: 10/15/2020 Elsevier Patient Education  2023 Elsevier Inc.  

## 2022-11-28 LAB — CERVICOVAGINAL ANCILLARY ONLY
Bacterial Vaginitis (gardnerella): NEGATIVE
Candida Glabrata: NEGATIVE
Candida Vaginitis: NEGATIVE
Comment: NEGATIVE
Comment: NEGATIVE
Comment: NEGATIVE

## 2022-11-29 LAB — URINE CULTURE

## 2022-12-01 ENCOUNTER — Other Ambulatory Visit: Payer: Self-pay | Admitting: Obstetrics & Gynecology

## 2022-12-01 MED ORDER — AMPICILLIN 500 MG PO CAPS: 500.0000 mg | ORAL_CAPSULE | Freq: Four times a day (QID) | ORAL | 0 refills | Status: DC

## 2022-12-01 NOTE — Progress Notes (Signed)
She is symptomatic for UTI. I will treat her strep UTI with amp.

## 2022-12-18 ENCOUNTER — Other Ambulatory Visit: Payer: Self-pay

## 2022-12-18 DIAGNOSIS — R102 Pelvic and perineal pain: Secondary | ICD-10-CM

## 2022-12-18 DIAGNOSIS — N950 Postmenopausal bleeding: Secondary | ICD-10-CM

## 2022-12-18 DIAGNOSIS — N952 Postmenopausal atrophic vaginitis: Secondary | ICD-10-CM

## 2022-12-18 NOTE — Telephone Encounter (Signed)
Patient needs to be seen, scheduled for 5/14. She states having medication that will cover her until then.

## 2022-12-23 ENCOUNTER — Ambulatory Visit: Payer: Federal, State, Local not specified - PPO | Admitting: Obstetrics and Gynecology

## 2022-12-23 ENCOUNTER — Encounter: Payer: Self-pay | Admitting: Obstetrics and Gynecology

## 2022-12-23 VITALS — BP 118/83 | HR 73 | Ht 67.0 in | Wt 159.3 lb

## 2022-12-23 DIAGNOSIS — N950 Postmenopausal bleeding: Secondary | ICD-10-CM

## 2022-12-23 DIAGNOSIS — Z79890 Hormone replacement therapy (postmenopausal): Secondary | ICD-10-CM

## 2022-12-23 DIAGNOSIS — R102 Pelvic and perineal pain: Secondary | ICD-10-CM

## 2022-12-23 DIAGNOSIS — N952 Postmenopausal atrophic vaginitis: Secondary | ICD-10-CM | POA: Diagnosis not present

## 2022-12-23 MED ORDER — ESTRADIOL 0.1 MG/GM VA CREA: TOPICAL_CREAM | VAGINAL | 3 refills | Status: AC

## 2022-12-23 NOTE — Progress Notes (Signed)
Patient presents today to follow-up on HRT medication. She states currently using Estradiol vaginally every other night. She states no pmb or pelvic pain at this time.

## 2022-12-23 NOTE — Progress Notes (Signed)
HPI:      [name] is a 62 y.o. [id number] who LMP was No LMP recorded. Patient has had an ablation.  Subjective:   She presents today stating that her vaginal estrogen cream is working very well for her.  She feels better she reports no more vaginal bleeding.  She has not yet had intercourse. She is struggling with long-term COVID and has respiratory issues she reports this is slowly getting better for her. She does have a history of stroke.    Hx: The following portions of the patient's history were reviewed and updated as appropriate:             She  has a past medical history of Asthma, Diabetes mellitus without complication (HCC), Hiatal hernia, Hypercholesteremia, Hypertension, and Long COVID. She does not have any pertinent problems on file. She  has a past surgical history that includes Cholecystectomy. Her family history includes Cancer in her mother; Diabetes in her father and mother; Throat cancer in her father. She  reports that she has never smoked. She has never used smokeless tobacco. She reports that she does not currently use drugs. She reports that she does not drink alcohol. She has a current medication list which includes the following prescription(s): albuterol, ampicillin, aspirin, atorvastatin, azithromycin, baclofen, benzonatate, cholecalciferol, fluconazole, furosemide, ketorolac, metformin, nortriptyline, ozempic (1 mg/dose), potassium chloride, rizatriptan, sitagliptin, valsartan-hydrochlorothiazide, cyanocobalamin, estradiol, magnesium oxide, methocarbamol, and sertraline. She is allergic to codeine.       Review of Systems:  Review of Systems  Constitutional: Denied constitutional symptoms, night sweats, recent illness, fatigue, fever, insomnia and weight loss.  Eyes: Denied eye symptoms, eye pain, photophobia, vision change and visual disturbance.  Ears/Nose/Throat/Neck: Denied ear, nose, throat or neck symptoms, hearing loss, nasal discharge, sinus  congestion and sore throat.  Cardiovascular: Denied cardiovascular symptoms, arrhythmia, chest pain/pressure, edema, exercise intolerance, orthopnea and palpitations.  Respiratory: Denied pulmonary symptoms, asthma, pleuritic pain, productive sputum, cough, dyspnea and wheezing.  Gastrointestinal: Denied, gastro-esophageal reflux, melena, nausea and vomiting.  Genitourinary: Denied genitourinary symptoms including symptomatic vaginal discharge, pelvic relaxation issues, and urinary complaints.  Musculoskeletal: Denied musculoskeletal symptoms, stiffness, swelling, muscle weakness and myalgia.  Dermatologic: Denied dermatology symptoms, rash and scar.  Neurologic: Denied neurology symptoms, dizziness, headache, neck pain and syncope.  Psychiatric: Denied psychiatric symptoms, anxiety and depression.  Endocrine: Denied endocrine symptoms including hot flashes and night sweats.   Meds:   Current Outpatient Medications on File Prior to Visit  Medication Sig Dispense Refill   albuterol (PROVENTIL HFA;VENTOLIN HFA) 108 (90 Base) MCG/ACT inhaler Inhale 2 puffs into the lungs every 6 (six) hours as needed for wheezing or shortness of breath. 1 Inhaler 2   ampicillin (PRINCIPEN) 500 MG capsule Take 1 capsule (500 mg total) by mouth 4 (four) times daily. 28 capsule 0   aspirin 81 MG chewable tablet Chew by mouth daily.     atorvastatin (LIPITOR) 40 MG tablet Take 40 mg by mouth daily.     azithromycin (ZITHROMAX Z-PAK) 250 MG tablet Take 1 tablet (250 mg total) by mouth daily. Take 2 tablets on day 1. Then 1 tablet daily. 6 tablet 0   baclofen (LIORESAL) 10 MG tablet Take 1 tablet (10 mg total) by mouth 3 (three) times daily. 30 each 0   benzonatate (TESSALON) 100 MG capsule Take 2 capsules (200 mg total) by mouth 3 (three) times daily as needed for cough. 21 capsule 0   cholecalciferol (VITAMIN D) 1000 units tablet Take 1,000 Units  by mouth daily.     fluconazole (DIFLUCAN) 150 MG tablet Take 1 tab po  q72h prn yeast infection 3 tablet 0   furosemide (LASIX) 20 MG tablet Take 20 mg by mouth every other day.     ketorolac (TORADOL) 10 MG tablet Take 1 tablet (10 mg total) by mouth every 6 (six) hours as needed. Prn headache 15 tablet 0   metFORMIN (GLUCOPHAGE) 1000 MG tablet Take 1,000 mg by mouth 2 (two) times daily with a meal.     nortriptyline (PAMELOR) 10 MG capsule Take 20 mg by mouth at bedtime.     OZEMPIC, 1 MG/DOSE, 4 MG/3ML SOPN 2 mg.     potassium chloride (K-DUR) 10 MEQ tablet Take 1 tablet by mouth every other day.     rizatriptan (MAXALT-MLT) 10 MG disintegrating tablet Take 10 mg by mouth as needed for migraine. May repeat in 2 hours if needed     sitaGLIPtin (JANUVIA) 100 MG tablet Take 100 mg by mouth daily.     valsartan-hydrochlorothiazide (DIOVAN-HCT) 320-12.5 MG tablet Take 1 tablet by mouth daily.     vitamin B-12 (CYANOCOBALAMIN) 1000 MCG tablet Take 1,000 mcg by mouth daily.     magnesium oxide (MAG-OX) 400 MG tablet Take 400 mg by mouth 3 (three) times daily. (Patient not taking: Reported on 12/23/2022)     methocarbamol (ROBAXIN) 750 MG tablet Take 750 mg by mouth 3 (three) times daily. (Patient not taking: Reported on 12/23/2022)     sertraline (ZOLOFT) 25 MG tablet Take by mouth.     No current facility-administered medications on file prior to visit.      Objective:     Vitals:   12/23/22 1109  BP: 118/83  Pulse: 73   Filed Weights   12/23/22 1109  Weight: 159 lb 4.8 oz (72.3 kg)                        Assessment:    [id number] Patient Active Problem List   Diagnosis Date Noted   Grief 04/08/2016     1. Hormone replacement therapy (HRT)   2. Postmenopausal bleeding   3. Pelvic pain in female   4. Vaginal atrophy     Very happy on vaginal HRT.  Would like to continue.   Plan:            1.  Continue estradiol vaginally every other day.  2.  Plan follow-up for annual exam. Orders No orders of the defined types were placed in this  encounter.    Meds ordered this encounter  Medications   estradiol (ESTRACE) 0.1 MG/GM vaginal cream    Sig: PLACE 0.25 APPLICATORFUL VAGINALLY AT BEDTIME    Dispense:  85 g    Refill:  3      F/U  No follow-ups on file.  Elonda Husky, M.D. 12/23/2022 11:49 AM

## 2023-02-16 ENCOUNTER — Ambulatory Visit
Admission: RE | Admit: 2023-02-16 | Discharge: 2023-02-16 | Disposition: A | Discharge status: Home / Self Care | Payer: Federal, State, Local not specified - PPO | Source: Ambulatory Visit | Attending: Emergency Medicine | Admitting: Emergency Medicine

## 2023-02-16 VITALS — BP 113/77 | HR 101 | Temp 98.6°F | Resp 18

## 2023-02-16 DIAGNOSIS — R21 Rash and other nonspecific skin eruption: Secondary | ICD-10-CM | POA: Diagnosis not present

## 2023-02-16 DIAGNOSIS — S20461A Insect bite (nonvenomous) of right back wall of thorax, initial encounter: Secondary | ICD-10-CM

## 2023-02-16 DIAGNOSIS — W57XXXA Bitten or stung by nonvenomous insect and other nonvenomous arthropods, initial encounter: Secondary | ICD-10-CM | POA: Diagnosis not present

## 2023-02-16 MED ORDER — DOXYCYCLINE HYCLATE 100 MG PO CAPS: 200.0000 mg | ORAL_CAPSULE | Freq: Once | ORAL | 0 refills | Status: AC

## 2023-02-16 NOTE — ED Triage Notes (Addendum)
Patient to Urgent Care with complaints of an insect bite on the right side of her lower abdomen. Reports body aches/ chills/ fatigue/ reports not feeling well.  Symptoms started three days ago. Has been putting vinegar on the bite.

## 2023-02-16 NOTE — ED Provider Notes (Signed)
Kristine Gilbert    CSN: [id number] Arrival date & time: 02/16/23  1746      History   Chief Complaint Chief Complaint  Patient presents with   Insect Bite    Bite on right side of waist, red. Have had aches chill and tiredness. - Entered by patient    HPI Kristine Gilbert is a 62 y.o. female.  Patient presents with an insect bite on her right flank x 3 days.  She has been applying topical vinegar and it is getting smaller.  She reports chills, fatigue, generalized malaise, body aches since the insect bite.  She is concerned for possible tick related illness.  She did not see what bit her and has not removed any ticks.  But she was in the woods prior to the insect bite.  No fever, rash, wound drainage, or other symptoms.  Her medical history includes hypertension and diabetes.  The history is provided by the patient and medical records.    Past Medical History:  Diagnosis Date   Asthma    Diabetes mellitus without complication (HCC)    Hiatal hernia    Hypercholesteremia    Hypertension    Long COVID     Patient Active Problem List   Diagnosis Date Noted   Grief 04/08/2016    Past Surgical History:  Procedure Laterality Date   CHOLECYSTECTOMY      OB History     Gravida  2   Para  2   Term  2   Preterm      AB      Living  2      SAB      IAB      Ectopic      Multiple      Live Births  2            Home Medications    Prior to Admission medications   Medication Sig Start Date End Date Taking? Authorizing Provider  doxycycline (VIBRAMYCIN) 100 MG capsule Take 2 capsules (200 mg total) by mouth once for 1 dose. 02/16/23 02/16/23 Yes Mickie Bail, NP  albuterol (PROVENTIL HFA;VENTOLIN HFA) 108 (90 Base) MCG/ACT inhaler Inhale 2 puffs into the lungs every 6 (six) hours as needed for wheezing or shortness of breath. 04/08/16   Minna Antis, MD  ampicillin (PRINCIPEN) 500 MG capsule Take 1 capsule (500 mg total) by mouth 4 (four) times  daily. 12/01/22   Allie Bossier, MD  aspirin 81 MG chewable tablet Chew by mouth daily.    [provider]  atorvastatin (LIPITOR) 40 MG tablet Take 40 mg by mouth daily.    [provider]  azithromycin (ZITHROMAX Z-PAK) 250 MG tablet Take 1 tablet (250 mg total) by mouth daily. Take 2 tablets on day 1. Then 1 tablet daily. 05/30/22   Brimage, Seward Meth, DO  baclofen (LIORESAL) 10 MG tablet Take 1 tablet (10 mg total) by mouth 3 (three) times daily. 10/21/21   Rodriguez-Southworth, Nettie Elm, PA-C  benzonatate (TESSALON) 100 MG capsule Take 2 capsules (200 mg total) by mouth 3 (three) times daily as needed for cough. 05/30/22   Katha Cabal, DO  cholecalciferol (VITAMIN D) 1000 units tablet Take 1,000 Units by mouth daily.    [provider]  estradiol (ESTRACE) 0.1 MG/GM vaginal cream PLACE 0.25 APPLICATORFUL VAGINALLY AT BEDTIME 12/23/22   Linzie Collin, MD  fluconazole (DIFLUCAN) 150 MG tablet Take 1 tab po q72h prn yeast infection 11/08/22  Shirlee Latch, PA-C  furosemide (LASIX) 20 MG tablet Take 20 mg by mouth every other day.    [provider]  ketorolac (TORADOL) 10 MG tablet Take 1 tablet (10 mg total) by mouth every 6 (six) hours as needed. Prn headache 10/21/21   Rodriguez-Southworth, Nettie Elm, PA-C  magnesium oxide (MAG-OX) 400 MG tablet Take 400 mg by mouth 3 (three) times daily. Patient not taking: Reported on 12/23/2022    [provider]  metFORMIN (GLUCOPHAGE) 1000 MG tablet Take 1,000 mg by mouth 2 (two) times daily with a meal.    [provider]  methocarbamol (ROBAXIN) 750 MG tablet Take 750 mg by mouth 3 (three) times daily. Patient not taking: Reported on 12/23/2022    [provider]  nortriptyline (PAMELOR) 10 MG capsule Take 20 mg by mouth at bedtime.    [provider]  OZEMPIC, 1 MG/DOSE, 4 MG/3ML SOPN 2 mg. 11/28/19   [provider]  potassium chloride (K-DUR) 10 MEQ tablet Take 1 tablet by  mouth every other day. 12/28/15   [provider]  rizatriptan (MAXALT-MLT) 10 MG disintegrating tablet Take 10 mg by mouth as needed for migraine. May repeat in 2 hours if needed    [provider]  sertraline (ZOLOFT) 25 MG tablet Take by mouth. 01/25/18 03/21/20  [provider]  sitaGLIPtin (JANUVIA) 100 MG tablet Take 100 mg by mouth daily.    [provider]  valsartan-hydrochlorothiazide (DIOVAN-HCT) 320-12.5 MG tablet Take 1 tablet by mouth daily.    [provider]  vitamin B-12 (CYANOCOBALAMIN) 1000 MCG tablet Take 1,000 mcg by mouth daily.    [provider]    Family History Family History  Problem Relation Age of Onset   Diabetes Mother    Cancer Mother    Diabetes Father    Throat cancer Father     Social History Social History   Tobacco Use   Smoking status: Never   Smokeless tobacco: Never  Vaping Use   Vaping Use: Never used  Substance Use Topics   Alcohol use: No   Drug use: Not Currently     Allergies   Codeine   Review of Systems Review of Systems  Constitutional:  Positive for chills and fatigue. Negative for fever.  HENT:  Negative for ear pain and sore throat.   Respiratory:  Negative for cough and shortness of breath.   Cardiovascular:  Negative for chest pain and palpitations.  Gastrointestinal:  Negative for diarrhea and vomiting.  Musculoskeletal:  Negative for arthralgias, gait problem and joint swelling.  Skin:  Positive for wound. Negative for rash.  Neurological:  Negative for weakness and numbness.     Physical Exam Triage Vital Signs ED Triage Vitals  Enc Vitals Group     BP 02/16/23 1816 113/77     Pulse Rate 02/16/23 1758 (!) 101     Resp 02/16/23 1758 18     Temp 02/16/23 1758 98.6 F (37 C)     Temp src --      SpO2 02/16/23 1758 96 %     Weight --      Height --      Head Circumference --      Peak Flow --      Pain Score 02/16/23 1813 5     Pain Loc --      Pain  Edu? --      Excl. in GC? --    No data found.  Updated Vital Signs BP 113/77   Pulse (!) 101   Temp 98.6 F (37 C)   Resp 18   SpO2 96%   Visual Acuity Right Eye Distance:   Left Eye Distance:   Bilateral Distance:    Right Eye Near:   Left Eye Near:    Bilateral Near:     Physical Exam Vitals and nursing note reviewed.  Constitutional:      General: She is not in acute distress.    Appearance: She is well-developed.  HENT:     Right Ear: Tympanic membrane normal.     Left Ear: Tympanic membrane normal.     Nose: Nose normal.     Mouth/Throat:     Mouth: Mucous membranes are moist.     Pharynx: Oropharynx is clear.  Cardiovascular:     Rate and Rhythm: Normal rate and regular rhythm.     Heart sounds: Normal heart sounds.  Pulmonary:     Effort: Pulmonary effort is normal. No respiratory distress.     Breath sounds: Normal breath sounds.  Musculoskeletal:     Cervical back: Neck supple.  Skin:    General: Skin is warm and dry.     Findings: Lesion present.     Comments: Small healing lesion with localized erythema on right flank.  No wound drainage.  Neurological:     Mental Status: She is alert.  Psychiatric:        Mood and Affect: Mood normal.        Behavior: Behavior normal.      UC Treatments / Results  Labs (all labs ordered are listed, but only abnormal results are displayed) Labs Reviewed  LYME DISEASE SEROLOGY W/REFLEX  SPOTTED FEVER GROUP ANTIBODIES    EKG   Radiology No results found.  Procedures Procedures (including critical care time)  Medications Ordered in UC Medications - No data to display  Initial Impression / Assessment and Plan / UC Course  I have reviewed the triage vital signs and the nursing notes.  Pertinent labs & imaging results that were available during my care of the patient were reviewed by me and considered in my medical decision making (see chart for details).    Insect bite with localized erythema  (rash).  No target rash.  No fever.  Patient is concerned for possible tick bite.  Treating with one-time dose of doxycycline 200 mg.  Lyme and RMSF pending.  Instructed patient to follow-up with her PCP.  Education provided on tick bites.  She agrees to plan of care.  Final Clinical Impressions(s) / UC Diagnoses   Final diagnoses:  Rash  Insect bite of right back wall of thorax, initial encounter     Discharge Instructions      Take the one-time dose of doxycycline as directed.  See the attached information on tick bites.  Blood work for Lyme and [employer] spotted fever are pending.  Follow-up with your primary care provider.     ED Prescriptions     Medication Sig Dispense Auth. Provider   doxycycline (VIBRAMYCIN) 100 MG capsule Take 2 capsules (200 mg total) by mouth once for 1 dose. 2 capsule Mickie Bail, NP      PDMP not reviewed this encounter.   Mickie Bail, NP 02/16/23 [phone]

## 2023-02-16 NOTE — Discharge Instructions (Addendum)
Take the one-time dose of doxycycline as directed.  See the attached information on tick bites.  Blood work for Lyme and [employer] spotted fever are pending.  Follow-up with your primary care provider.

## 2023-02-18 IMAGING — CR DG KNEE COMPLETE 4+V*L*
4 series · 4 of 4 positions shown · non-contrast
Comparison: None.

CLINICAL DATA: Atraumatic left knee pain.

EXAM:
LEFT KNEE - COMPLETE 4+ VIEW

[knee ap]
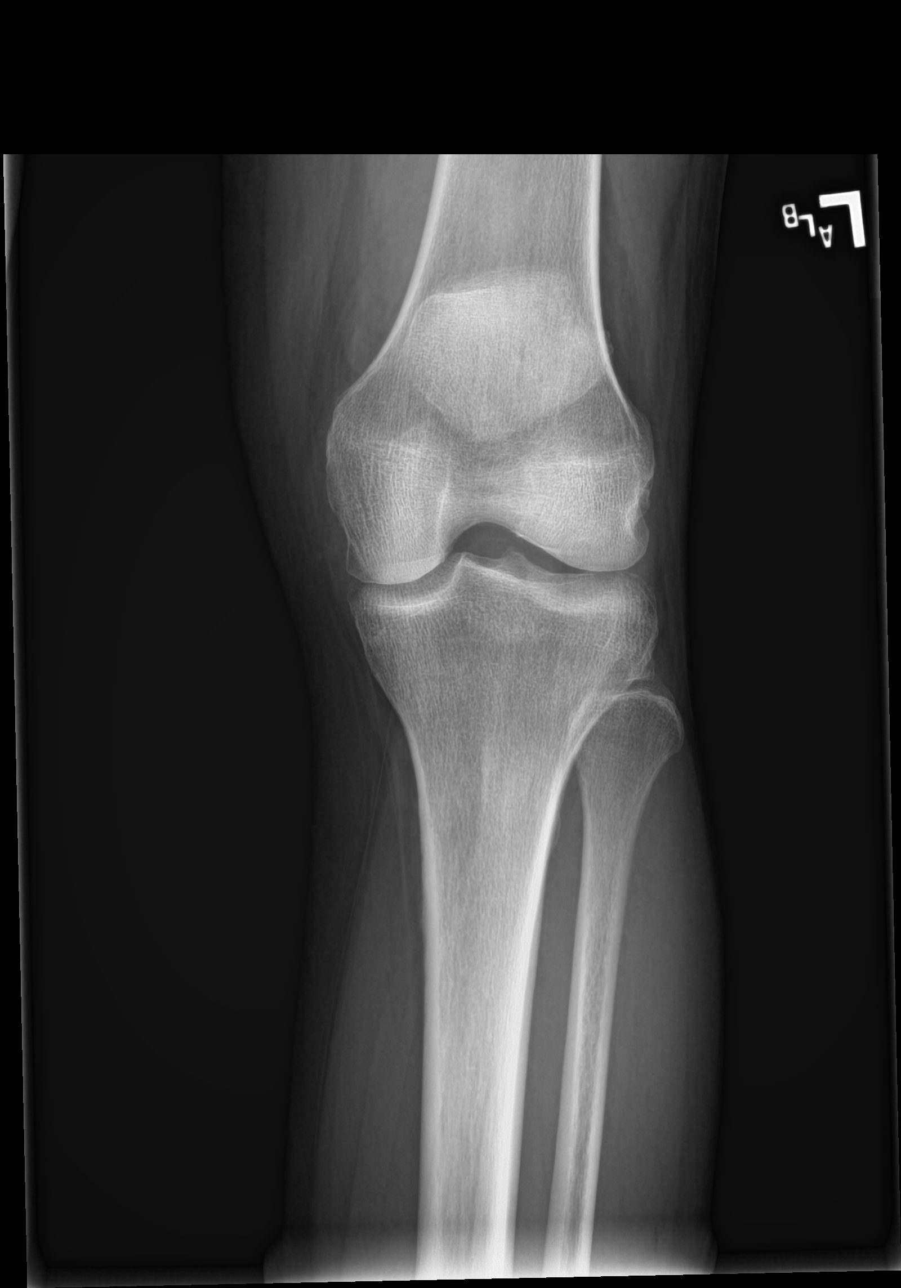

[knee lat]
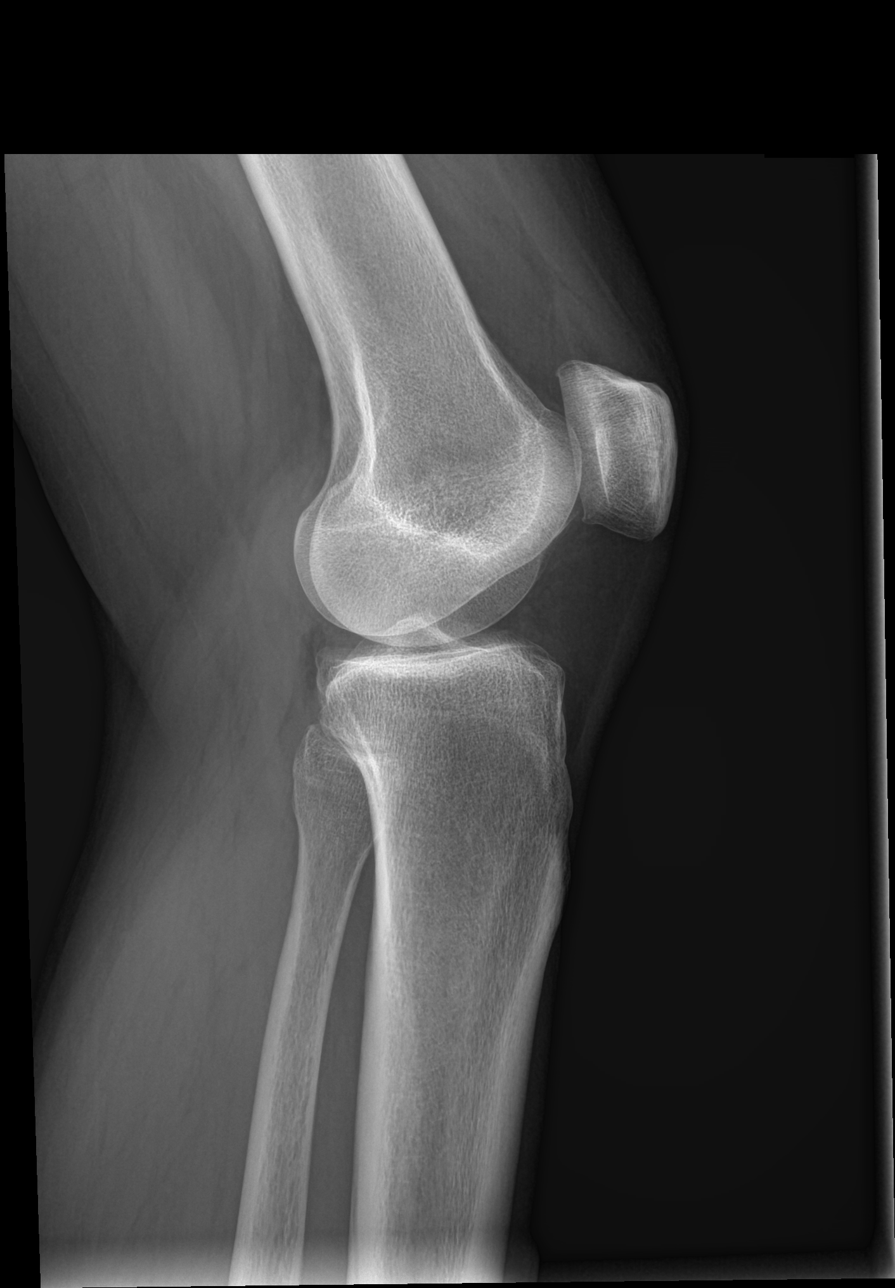

[tunnel]
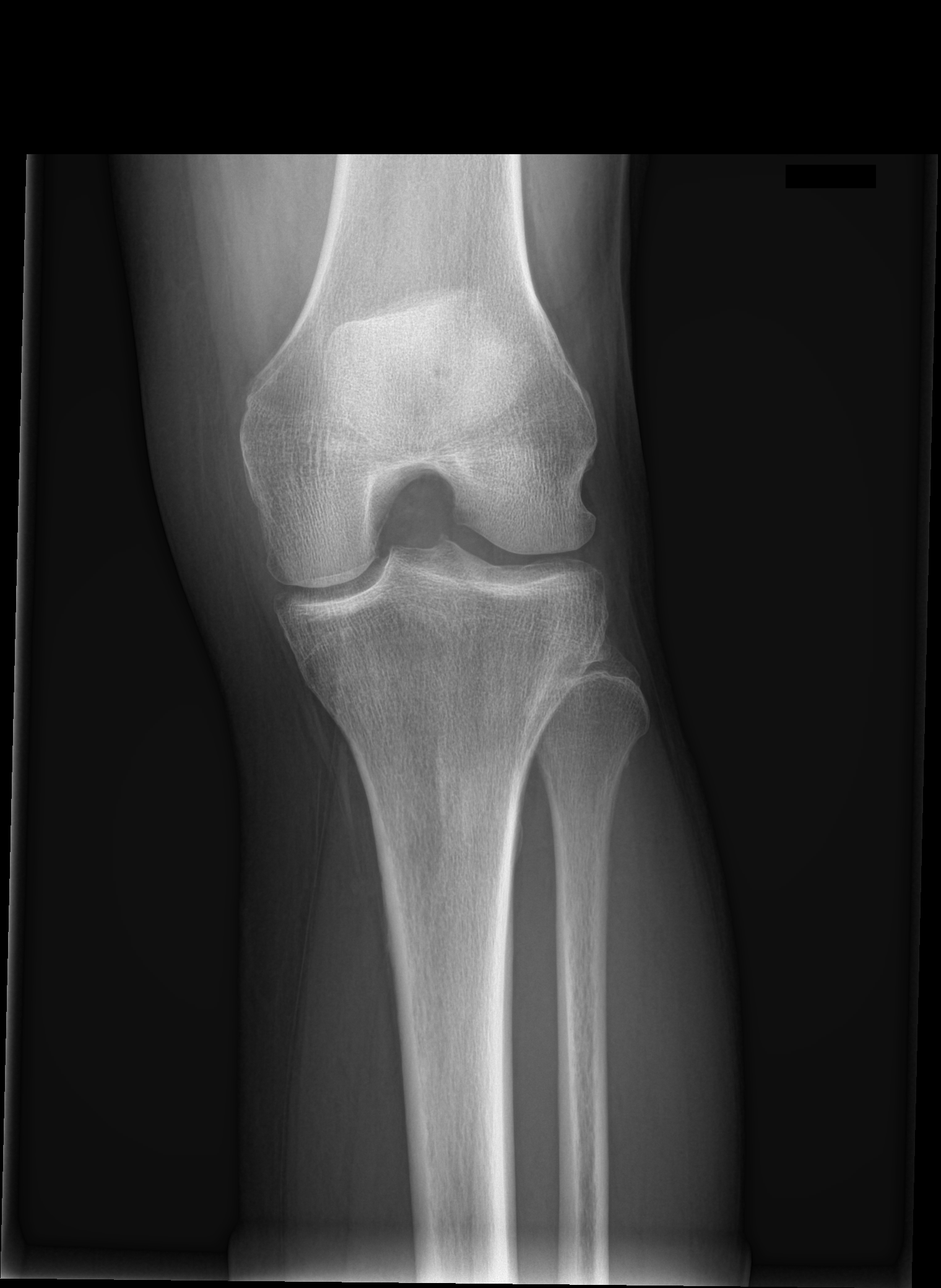

[patella skyline]
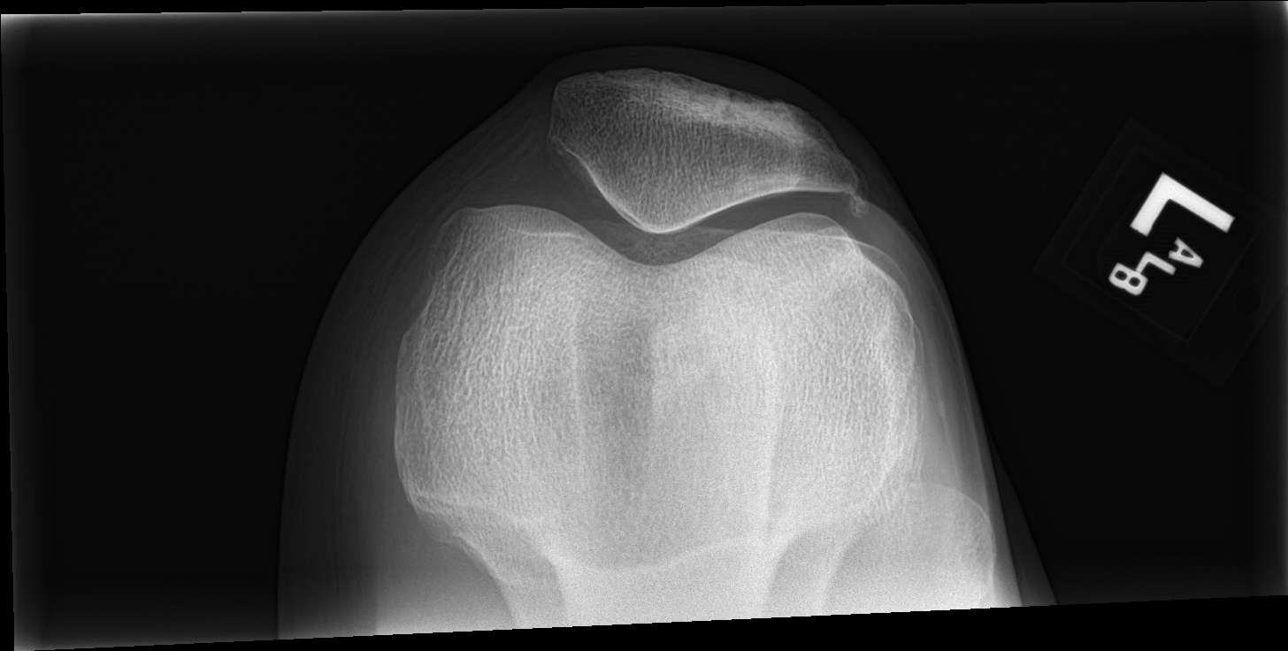

[4 of 4 positions shown; findings below may reference images not displayed]

FINDINGS: No evidence of fracture, dislocation, or joint effusion. No evidence
of arthropathy or other focal bone abnormality. Soft tissues are
unremarkable.
IMPRESSION: Negative.

## 2023-03-04 ENCOUNTER — Telehealth: Payer: Self-pay | Admitting: Emergency Medicine

## 2023-03-10 ENCOUNTER — Other Ambulatory Visit: Payer: Self-pay | Admitting: Neurology

## 2023-03-10 DIAGNOSIS — M542 Cervicalgia: Secondary | ICD-10-CM

## 2023-03-14 ENCOUNTER — Ambulatory Visit
Admission: RE | Admit: 2023-03-14 | Discharge: 2023-03-14 | Disposition: A | Discharge status: Home / Self Care | Payer: Federal, State, Local not specified - PPO | Source: Ambulatory Visit | Attending: Neurology | Admitting: Neurology

## 2023-03-14 DIAGNOSIS — M542 Cervicalgia: Secondary | ICD-10-CM | POA: Diagnosis present

## 2023-05-04 ENCOUNTER — Other Ambulatory Visit: Payer: Self-pay | Admitting: Internal Medicine

## 2023-05-04 DIAGNOSIS — Z1231 Encounter for screening mammogram for malignant neoplasm of breast: Secondary | ICD-10-CM

## 2023-12-22 ENCOUNTER — Other Ambulatory Visit: Payer: Self-pay | Admitting: Physician Assistant

## 2023-12-22 ENCOUNTER — Ambulatory Visit
Admission: RE | Admit: 2023-12-22 | Discharge: 2023-12-22 | Disposition: A | Discharge status: Home / Self Care | Source: Ambulatory Visit | Attending: Physician Assistant | Admitting: Physician Assistant

## 2023-12-22 DIAGNOSIS — R531 Weakness: Secondary | ICD-10-CM | POA: Insufficient documentation

## 2023-12-22 DIAGNOSIS — J4541 Moderate persistent asthma with (acute) exacerbation: Secondary | ICD-10-CM | POA: Insufficient documentation

## 2023-12-22 DIAGNOSIS — R0609 Other forms of dyspnea: Secondary | ICD-10-CM

## 2023-12-22 DIAGNOSIS — R0781 Pleurodynia: Secondary | ICD-10-CM

## 2023-12-22 LAB — POCT I-STAT CREATININE: Creatinine, Ser: 0.7 mg/dL (ref 0.44–1.00)

## 2023-12-22 MED ORDER — IOHEXOL 300 MG/ML  SOLN
75.0000 mL | Freq: Once | INTRAMUSCULAR | Status: AC | PRN
  Administered 2023-12-22: 75 mL via INTRAVENOUS

## 2023-12-30 ENCOUNTER — Other Ambulatory Visit: Payer: Self-pay

## 2023-12-30 ENCOUNTER — Ambulatory Visit
Admission: RE | Admit: 2023-12-30 | Discharge: 2023-12-30 | Disposition: A | Discharge status: Home / Self Care | Source: Ambulatory Visit

## 2023-12-30 DIAGNOSIS — M79605 Pain in left leg: Secondary | ICD-10-CM | POA: Diagnosis present

## 2023-12-31 DIAGNOSIS — I82409 Acute embolism and thrombosis of unspecified deep veins of unspecified lower extremity: Secondary | ICD-10-CM

## 2023-12-31 HISTORY — DX: Acute embolism and thrombosis of unspecified deep veins of unspecified lower extremity: I82.409

## 2024-01-03 ENCOUNTER — Emergency Department

## 2024-01-03 ENCOUNTER — Inpatient Hospital Stay

## 2024-01-03 ENCOUNTER — Other Ambulatory Visit: Payer: Self-pay

## 2024-01-03 ENCOUNTER — Inpatient Hospital Stay
Admission: EM | Admit: 2024-01-03 | Discharge: 2024-01-05 | DRG: 176 | Disposition: A | Discharge status: Home Health | Attending: Internal Medicine | Admitting: Internal Medicine

## 2024-01-03 DIAGNOSIS — Z808 Family history of malignant neoplasm of other organs or systems: Secondary | ICD-10-CM

## 2024-01-03 DIAGNOSIS — I10 Essential (primary) hypertension: Secondary | ICD-10-CM | POA: Diagnosis present

## 2024-01-03 DIAGNOSIS — Z86718 Personal history of other venous thrombosis and embolism: Secondary | ICD-10-CM

## 2024-01-03 DIAGNOSIS — Z7982 Long term (current) use of aspirin: Secondary | ICD-10-CM

## 2024-01-03 DIAGNOSIS — Z882 Allergy status to sulfonamides status: Secondary | ICD-10-CM

## 2024-01-03 DIAGNOSIS — Z7985 Long-term (current) use of injectable non-insulin antidiabetic drugs: Secondary | ICD-10-CM

## 2024-01-03 DIAGNOSIS — I2694 Multiple subsegmental pulmonary emboli without acute cor pulmonale: Principal | ICD-10-CM | POA: Diagnosis present

## 2024-01-03 DIAGNOSIS — J45909 Unspecified asthma, uncomplicated: Secondary | ICD-10-CM | POA: Diagnosis present

## 2024-01-03 DIAGNOSIS — E119 Type 2 diabetes mellitus without complications: Secondary | ICD-10-CM

## 2024-01-03 DIAGNOSIS — Z87892 Personal history of anaphylaxis: Secondary | ICD-10-CM

## 2024-01-03 DIAGNOSIS — Z7984 Long term (current) use of oral hypoglycemic drugs: Secondary | ICD-10-CM | POA: Diagnosis not present

## 2024-01-03 DIAGNOSIS — Z833 Family history of diabetes mellitus: Secondary | ICD-10-CM

## 2024-01-03 DIAGNOSIS — I82452 Acute embolism and thrombosis of left peroneal vein: Secondary | ICD-10-CM | POA: Diagnosis present

## 2024-01-03 DIAGNOSIS — I69354 Hemiplegia and hemiparesis following cerebral infarction affecting left non-dominant side: Secondary | ICD-10-CM | POA: Diagnosis not present

## 2024-01-03 DIAGNOSIS — U099 Post covid-19 condition, unspecified: Secondary | ICD-10-CM | POA: Diagnosis present

## 2024-01-03 DIAGNOSIS — Z79899 Other long term (current) drug therapy: Secondary | ICD-10-CM

## 2024-01-03 DIAGNOSIS — Z885 Allergy status to narcotic agent status: Secondary | ICD-10-CM

## 2024-01-03 DIAGNOSIS — E7800 Pure hypercholesterolemia, unspecified: Secondary | ICD-10-CM | POA: Diagnosis present

## 2024-01-03 DIAGNOSIS — M940 Chondrocostal junction syndrome [Tietze]: Secondary | ICD-10-CM | POA: Diagnosis present

## 2024-01-03 DIAGNOSIS — Z86711 Personal history of pulmonary embolism: Secondary | ICD-10-CM | POA: Diagnosis present

## 2024-01-03 DIAGNOSIS — I2699 Other pulmonary embolism without acute cor pulmonale: Secondary | ICD-10-CM | POA: Diagnosis present

## 2024-01-03 LAB — BASIC METABOLIC PANEL WITH GFR
Anion gap: 11 (ref 5–15)
BUN: 17 mg/dL (ref 8–23)
CO2: 20 mmol/L — ABNORMAL LOW (ref 22–32)
Calcium: 8.8 mg/dL — ABNORMAL LOW (ref 8.9–10.3)
Chloride: 102 mmol/L (ref 98–111)
Creatinine, Ser: 0.78 mg/dL (ref 0.44–1.00)
GFR, Estimated: >60 mL/min (ref >60)
Glucose, Bld: 143 mg/dL — ABNORMAL HIGH (ref 70–99)
Potassium: 4.3 mmol/L (ref 3.5–5.1)
Sodium: 133 mmol/L — ABNORMAL LOW (ref 135–145)

## 2024-01-03 LAB — CBC
HCT: 46.5 % — ABNORMAL HIGH (ref 36.0–46.0)
Hemoglobin: 15.2 g/dL — ABNORMAL HIGH (ref 12.0–15.0)
MCH: 29.8 pg (ref 26.0–34.0)
MCHC: 32.7 g/dL (ref 30.0–36.0)
MCV: 91.2 fL (ref 80.0–100.0)
Platelets: 163 10*3/uL (ref 150–400)
RBC: 5.1 MIL/uL (ref 3.87–5.11)
RDW: 13.2 % (ref 11.5–15.5)
WBC: 5.7 10*3/uL (ref 4.0–10.5)
nRBC: 0 % (ref 0.0–0.2)

## 2024-01-03 LAB — PROTIME-INR
INR: 1 (ref 0.8–1.2)
Prothrombin Time: 13.4 s (ref 11.4–15.2)

## 2024-01-03 LAB — HEMOGLOBIN A1C
Hgb A1c MFr Bld: 7.9 % — ABNORMAL HIGH (ref 4.8–5.6)
Mean Plasma Glucose: 180.03 mg/dL

## 2024-01-03 LAB — HEPARIN LEVEL (UNFRACTIONATED): Heparin Unfractionated: >1.1 [IU]/mL — ABNORMAL HIGH (ref 0.30–0.70)

## 2024-01-03 LAB — APTT: aPTT: 22 s — ABNORMAL LOW (ref 24–36)

## 2024-01-03 LAB — TROPONIN I (HIGH SENSITIVITY)
Troponin I (High Sensitivity): 3 ng/L (ref <18)
Troponin I (High Sensitivity): 3 ng/L (ref <18)

## 2024-01-03 MED ORDER — VITAMIN D 25 MCG (1000 UNIT) PO TABS
1000.0000 [IU] | ORAL_TABLET | Freq: Every day | ORAL | Status: DC
  Administered 2024-01-04 – 2024-01-05 (×2): 1000 [IU] via ORAL
  Filled 2024-01-03 (×2): qty 1

## 2024-01-03 MED ORDER — NORTRIPTYLINE HCL 10 MG PO CAPS
20.0000 mg | ORAL_CAPSULE | Freq: Every day | ORAL | Status: DC
  Administered 2024-01-04: 20 mg via ORAL
  Filled 2024-01-03: qty 2

## 2024-01-03 MED ORDER — ACETAMINOPHEN 325 MG PO TABS
650.0000 mg | ORAL_TABLET | Freq: Four times a day (QID) | ORAL | Status: DC | PRN
  Administered 2024-01-04 – 2024-01-05 (×4): 650 mg via ORAL
  Filled 2024-01-03 (×4): qty 2

## 2024-01-03 MED ORDER — ONDANSETRON HCL 4 MG PO TABS: 4.0000 mg | ORAL_TABLET | Freq: Four times a day (QID) | ORAL | Status: DC | PRN

## 2024-01-03 MED ORDER — ACETAMINOPHEN 650 MG RE SUPP: 650.0000 mg | Freq: Four times a day (QID) | RECTAL | Status: DC | PRN

## 2024-01-03 MED ORDER — ONDANSETRON HCL 4 MG/2ML IJ SOLN
4.0000 mg | Freq: Four times a day (QID) | INTRAMUSCULAR | Status: DC | PRN
  Administered 2024-01-04: 4 mg via INTRAVENOUS
  Filled 2024-01-03: qty 2

## 2024-01-03 MED ORDER — INSULIN ASPART 100 UNIT/ML IJ SOLN
0.0000 [IU] | INTRAMUSCULAR | Status: DC
  Administered 2024-01-04 (×2): 2 [IU] via SUBCUTANEOUS
  Administered 2024-01-04: 1 [IU] via SUBCUTANEOUS
  Administered 2024-01-04: 3 [IU] via SUBCUTANEOUS
  Administered 2024-01-05: 1 [IU] via SUBCUTANEOUS
  Filled 2024-01-03 (×5): qty 1

## 2024-01-03 MED ORDER — INSULIN ASPART 100 UNIT/ML IJ SOLN: 0.0000 [IU] | Freq: Three times a day (TID) | INTRAMUSCULAR | Status: DC

## 2024-01-03 MED ORDER — ATORVASTATIN CALCIUM 20 MG PO TABS
40.0000 mg | ORAL_TABLET | Freq: Every day | ORAL | Status: DC
  Administered 2024-01-03 – 2024-01-05 (×3): 40 mg via ORAL
  Filled 2024-01-03 (×3): qty 2

## 2024-01-03 MED ORDER — SENNOSIDES-DOCUSATE SODIUM 8.6-50 MG PO TABS: 1.0000 | ORAL_TABLET | Freq: Every evening | ORAL | Status: DC | PRN

## 2024-01-03 MED ORDER — HEPARIN (PORCINE) 25000 UT/250ML-% IV SOLN
1100.0000 [IU]/h | INTRAVENOUS | Status: DC
  Administered 2024-01-03 – 2024-01-04 (×2): 1200 [IU]/h via INTRAVENOUS
  Filled 2024-01-03 (×2): qty 250

## 2024-01-03 MED ORDER — LORAZEPAM 2 MG/ML IJ SOLN
1.0000 mg | Freq: Once | INTRAMUSCULAR | Status: AC
  Administered 2024-01-03: 1 mg via INTRAVENOUS
  Filled 2024-01-03: qty 1

## 2024-01-03 MED ORDER — INSULIN ASPART 100 UNIT/ML IJ SOLN: 0.0000 [IU] | Freq: Every day | INTRAMUSCULAR | Status: DC

## 2024-01-03 MED ORDER — VITAMIN B-12 1000 MCG PO TABS
1000.0000 ug | ORAL_TABLET | Freq: Every day | ORAL | Status: DC
  Administered 2024-01-04 – 2024-01-05 (×2): 1000 ug via ORAL
  Filled 2024-01-03 (×2): qty 1

## 2024-01-03 MED ORDER — INSULIN ASPART 100 UNIT/ML IJ SOLN: 0.0000 [IU] | Freq: Every day | INTRAMUSCULAR | Status: AC

## 2024-01-03 MED ORDER — IOHEXOL 350 MG/ML SOLN
75.0000 mL | Freq: Once | INTRAVENOUS | Status: AC | PRN
  Administered 2024-01-03: 75 mL via INTRAVENOUS

## 2024-01-03 NOTE — ED Notes (Signed)
 Pt able to use left arm to hold water cup up to mouth to drink.

## 2024-01-03 NOTE — ED Notes (Signed)
 Pt returned from CT

## 2024-01-03 NOTE — ED Notes (Signed)
 Pt. returned from XR.

## 2024-01-03 NOTE — Consult Note (Signed)
 PHARMACY - ANTICOAGULATION CONSULT NOTE  Pharmacy Consult for heparin infusion Indication: DVT/PE  Allergies  Allergen Reactions   Codeine Itching   Patient Measurements: Height: 5\' 7"  (170.2 cm) Weight: 68.5 kg (151 lb) IBW/kg (Calculated) : 61.6 HEPARIN DW (KG): 68.5  Vital Signs: Temp: 98 F (36.7 C) (05/25 1602) Temp Source: Oral (05/25 1602) BP: 131/82 (05/25 1630) Pulse Rate: 87 (05/25 1630)  Labs: Recent Labs    01/03/24 1614 01/03/24 1748  HGB 15.2*  --   HCT 46.5*  --   PLT 163  --   APTT  --  22*  LABPROT  --  13.4  INR  --  1.0  HEPARINUNFRC  --  >1.10*  CREATININE 0.78  --   TROPONINIHS 3  --    Estimated Creatinine Clearance: 71.8 mL/min (by C-G formula based on SCr of 0.78 mg/dL).  Medical History: Past Medical History:  Diagnosis Date   Asthma    Diabetes mellitus without complication (HCC)    DVT (deep venous thrombosis) (HCC) 12/31/2023   on eliquis   Hiatal hernia    Hypercholesteremia    Hypertension    Long COVID     Medications:  Apixaban started pack (start date 5/21; last dose today 5/25 at 8 am)  Assessment: 63 yo female admitted with increasing shortness of breath and chest tightness. Recently diagnosed with DVT on May/21 and started on Eliquis started pack. PMH includes long-haul COVID, hypertension, history of stroke with mild left-sided weakness, type II diabetes, and hyperlipidemia.  Hgb and plt appropriate to start heparin infusion. Baseline aPTT, INR and HL ordered prior to initiating heparin treatment. HL > 1.10, will monitor infusion with aPTT for now.  Goal of Therapy:  Heparin level 0.3-0.7 units/ml aPTT 66-102 seconds Monitor platelets by anticoagulation protocol: Yes   Plan:  No bolus indicated, last Eliqius dose less than 24 hrs ago. Start heparin infusion at 1200 units/hr Check aPTT  level  6 hrs after start of heparin infusion Will check aPTT until HL correlating  Continue to monitor H&H and  platelets  Chinwe Lope Rodriguez-Guzman PharmD, BCPS 01/03/2024 6:17 PM

## 2024-01-03 NOTE — H&P (Signed)
 History and Physical    Patient: Kristine Gilbert ZOX:[id number] DOB: [birth year] DOA: 01/03/2024 DOS: the patient was seen and examined on 01/03/2024 PCP: Rex Castor, MD  Patient coming from: Home  Chief Complaint:  Chief Complaint  Patient presents with   Chest Pain   DVT   Dizziness   Shortness of Breath   HPI: Kristine Gilbert is a 63 y.o. female with medical history significant of long-haul COVID, hypertension, history of stroke with mild left-sided weakness, type II diabetes, hyperlipidemia, recent diagnosis of left peroneal DVT on 21st of May started on PO eliquis comes to the emergency room accompanied with son and daughter with increasing shortness of breath and chest tightness. Patient also felt since her leg blood clot she has been feeling overall week and seems to fill her left-sided weakness is worsen.  Workup in the ER showed patient hemodynamically stable no respiratory distress sats were 98% during my evaluation. Heartrate is 88. CT scan of the lung with contrast showed right sub segmental/middle lobe PE. CT of the head negative for stroke. MRI of the brain is pending. Patient is started on IV heparin drip. She is being admitted for further evaluation and management of acute PE and known history of recent left lower extremity DVT.  Review of Systems: As mentioned in the history of present illness. All other systems reviewed and are negative. Past Medical History:  Diagnosis Date   Asthma    Diabetes mellitus without complication (HCC)    DVT (deep venous thrombosis) (HCC) 12/31/2023   on eliquis   Hiatal hernia    Hypercholesteremia    Hypertension    Long COVID    Past Surgical History:  Procedure Laterality Date   CHOLECYSTECTOMY     Social History:  reports that she has never smoked. She has never used smokeless tobacco. She reports that she does not currently use drugs. She reports that she does not drink alcohol.  Allergies  Allergen Reactions    Codeine Itching    Family History  Problem Relation Age of Onset   Diabetes Mother    Cancer Mother    Diabetes Father    Throat cancer Father     Prior to Admission medications   Medication Sig Start Date End Date Taking? Authorizing Provider  albuterol  (PROVENTIL  HFA;VENTOLIN  HFA) 108 (90 Base) MCG/ACT inhaler Inhale 2 puffs into the lungs every 6 (six) hours as needed for wheezing or shortness of breath. 04/08/16   Ruth Cove, MD  ampicillin  (PRINCIPEN) 500 MG capsule Take 1 capsule (500 mg total) by mouth 4 (four) times daily. 12/01/22   Dove, Myra C, MD  aspirin 81 MG chewable tablet Chew by mouth daily.    [provider]  atorvastatin (LIPITOR) 40 MG tablet Take 40 mg by mouth daily.    [provider]  azithromycin  (ZITHROMAX  Z-PAK) 250 MG tablet Take 1 tablet (250 mg total) by mouth daily. Take 2 tablets on day 1. Then 1 tablet daily. 05/30/22   Brimage, Vondra, DO  baclofen  (LIORESAL ) 10 MG tablet Take 1 tablet (10 mg total) by mouth 3 (three) times daily. 10/21/21   Rodriguez-Southworth, Sylvia, PA-C  benzonatate  (TESSALON ) 100 MG capsule Take 2 capsules (200 mg total) by mouth 3 (three) times daily as needed for cough. 05/30/22   Brimage, Vondra, DO  cholecalciferol (VITAMIN D) 1000 units tablet Take 1,000 Units by mouth daily.    [provider]  estradiol  (ESTRACE ) 0.1 MG/GM vaginal cream PLACE 0.25 APPLICATORFUL VAGINALLY AT BEDTIME  12/23/22   Zenobia Hila, MD  fluconazole  (DIFLUCAN ) 150 MG tablet Take 1 tab po q72h prn yeast infection 11/08/22   Nancy Axon B, PA-C  furosemide (LASIX) 20 MG tablet Take 20 mg by mouth every other day.    [provider]  ketorolac  (TORADOL ) 10 MG tablet Take 1 tablet (10 mg total) by mouth every 6 (six) hours as needed. Prn headache 10/21/21   Rodriguez-Southworth, Sylvia, PA-C  metFORMIN (GLUCOPHAGE) 1000 MG tablet Take 1,000 mg by mouth 2 (two) times daily with a meal.    [provider]   nortriptyline (PAMELOR) 10 MG capsule Take 20 mg by mouth at bedtime.    [provider]  OZEMPIC, 1 MG/DOSE, 4 MG/3ML SOPN 2 mg. 11/28/19   [provider]  potassium chloride (K-DUR) 10 MEQ tablet Take 1 tablet by mouth every other day. 12/28/15   [provider]  rizatriptan (MAXALT-MLT) 10 MG disintegrating tablet Take 10 mg by mouth as needed for migraine. May repeat in 2 hours if needed    [provider]  sertraline (ZOLOFT) 25 MG tablet Take by mouth. 01/25/18 03/21/20  [provider]  sitaGLIPtin (JANUVIA) 100 MG tablet Take 100 mg by mouth daily.    [provider]  valsartan-hydrochlorothiazide (DIOVAN-HCT) 320-12.5 MG tablet Take 1 tablet by mouth daily.    [provider]  vitamin B-12 (CYANOCOBALAMIN) 1000 MCG tablet Take 1,000 mcg by mouth daily.    [provider]    Physical Exam: Vitals:   01/03/24 1601 01/03/24 1602 01/03/24 1630  BP:  135/75 131/82  Pulse:  87 87  Resp:  20 16  Temp:  98 F (36.7 C)   TempSrc:  Oral   SpO2:  98% 100%  Weight: 68.5 kg    Height: 5\' 7"  (1.702 m)    Physical Exam Constitutional:      Appearance: She is well-developed.  HENT:     Head: Atraumatic.  Cardiovascular:     Rate and Rhythm: Normal rate and regular rhythm.     Heart sounds: Normal heart sounds.  Pulmonary:     Breath sounds: Normal breath sounds.  Abdominal:     General: Bowel sounds are normal.     Palpations: Abdomen is soft.  Musculoskeletal:     Cervical back: Normal range of motion and neck supple.  Neurological:     Mental Status: She is alert.     Motor: Weakness present.     Comments: Mild left sided weakness      Kristine Gilbert is a 63 y.o. female with medical history significant of long-haul COVID, hypertension, history of stroke with mild left-sided weakness, type II diabetes, hyperlipidemia, recent diagnosis of left peroneal DVT on 21st of May started on PO eliquis comes to the  emergency room accompanied with son and daughter with increasing shortness of breath and chest tightness. Patient also felt since her leg blood clot she has been feeling overall week and seems to fill her left-sided weakness is worsen.   Acute PE right subsegmental/right middle lobe history of left peroneal  DVT diagnosed May 21st -- IV heparin drip -- hold eliquis for now -- echo of the heart -- will discuss case with vascular surgery if she needs any thrombectomy  Left-sided weakness with history of stroke in the remote past -- MRI of the brain -- CT head no acute intracranial abnormality -- will consider neurology consultation if needed -- per Dr. Belenda Bowie ER she failed swallow  eval. Will have speech language pathology see tomorrow -- PT OT  Type II diabetes, with hyperlipidemia -- sliding scale insulin -- will resume home meds once med rec done  Hypercholesterolemia -- continue statins  History of asthma/long-haul COVID -- patient follows with [facility] pulmonology -- currently sats are 98% on room air. -- Will continue inhalers  About was discussed with patient and her son and daughter in the room     Advance Care Planning:   Code Status: Full Code per patient  Consults: none today  Family Communication: dter/son  Severity of Illness: The appropriate patient status for this patient is INPATIENT. Inpatient status is judged to be reasonable and necessary in order to provide the required intensity of service to ensure the patient's safety. The patient's presenting symptoms, physical exam findings, and initial radiographic and laboratory data in the context of their chronic comorbidities is felt to place them at high risk for further clinical deterioration. Furthermore, it is not anticipated that the patient will be medically stable for discharge from the hospital within 2 midnights of admission.   * I certify that at the point of admission it is my clinical judgment that the  patient will require inpatient hospital care spanning beyond 2 midnights from the point of admission due to high intensity of service, high risk for further deterioration and high frequency of surveillance required.*  Author: Melvinia Stager, MD 01/03/2024 5:56 PM  For on call review [email or web address].

## 2024-01-03 NOTE — ED Notes (Signed)
 Pt stating her Left arm hurts the same way her leg hurt the other day when she was dx with a DVT. Pt also stating her Left eye hurts. Eye appears very red, no swelling noted. Per daughter her eye has been like this for about a week.

## 2024-01-03 NOTE — ED Triage Notes (Signed)
 Pt to ED with son and daughter for CP and dizziness since 2 days ago, has known DVT to L leg, on Eliquis. Also feels SOB. Pt slow to answer questions. Pt has unlabored breathing, skin dry.

## 2024-01-03 NOTE — ED Notes (Signed)
 Pt taken to XR.

## 2024-01-03 NOTE — ED Notes (Signed)
 Ppt taken to MRI

## 2024-01-03 NOTE — ED Provider Notes (Signed)
 [facility] Provider Note    Event Date/Time   First MD Initiated Contact with Patient 01/03/24 1558     (approximate)   History   Chest Pain, DVT, Dizziness, and Shortness of Breath   HPI  Kristine Gilbert is a 63 y.o. female with a history of asthma, diabetes, hypertension, recently diagnosed with DVT of the left leg accidentally 4 days ago.  She presents today with generalized weakness, chest discomfort, mild shortness of breath and dizziness.     Physical Exam   Triage Vital Signs: ED Triage Vitals  Encounter Vitals Group     BP 01/03/24 1602 135/75     Systolic BP Percentile --      Diastolic BP Percentile --      Pulse Rate 01/03/24 1602 87     Resp 01/03/24 1602 20     Temp 01/03/24 1602 98 F (36.7 C)     Temp Source 01/03/24 1602 Oral     SpO2 01/03/24 1602 98 %     Weight 01/03/24 1601 68.5 kg (151 lb)     Height 01/03/24 1601 1.702 m (5\' 7" )     Head Circumference --      Peak Flow --      Pain Score 01/03/24 1557 7     Pain Loc --      Pain Education --      Exclude from Growth Chart --     Most recent vital signs: Vitals:   01/03/24 1602 01/03/24 1630  BP: 135/75 131/82  Pulse: 87 87  Resp: 20 16  Temp: 98 F (36.7 C)   SpO2: 98% 100%     General: Awake, no distress.  CV:  Good peripheral perfusion.  Regular rate and rhythm Resp:  Normal effort.  Clear to auscultation, no wheezing Abd:  No distention.  Other:  On neurologic exam patient appears to have decreased sensation in the left arm and left leg compared to the right.  On tongue extension her tongue is to the right.  Question possible mild left facial droop.  She reports that when she holds up her left arm and left leg they do feel heavy but reports her whole body feels weak and that this has been ongoing for several days   ED Results / Procedures / Treatments   Labs (all labs ordered are listed, but only abnormal results are displayed) Labs Reviewed  BASIC  METABOLIC PANEL WITH GFR - Abnormal; Notable for the following components:      Result Value   Sodium 133 (*)    CO2 20 (*)    Glucose, Bld 143 (*)    Calcium 8.8 (*)    All other components within normal limits  CBC - Abnormal; Notable for the following components:   Hemoglobin 15.2 (*)    HCT 46.5 (*)    All other components within normal limits  HEPARIN LEVEL (UNFRACTIONATED) - Abnormal; Notable for the following components:   Heparin Unfractionated >1.10 (*)    All other components within normal limits  APTT - Abnormal; Notable for the following components:   aPTT 22 (*)    All other components within normal limits  PROTIME-INR  URINE DRUG SCREEN, QUALITATIVE ([facility] ONLY)  HEMOGLOBIN A1C  APTT  TROPONIN I (HIGH SENSITIVITY)  TROPONIN I (HIGH SENSITIVITY)     EKG  ED ECG REPORT I, Bryson Carbine, the attending physician, personally viewed and interpreted this ECG.  Date: 01/03/2024  Rhythm: normal sinus  rhythm QRS Axis: normal Intervals: normal ST/T Wave abnormalities: normal Narrative Interpretation: no evidence of acute ischemia    RADIOLOGY Chest x-ray viewed interpret by me, no acute normality,    PROCEDURES:  Critical Care performed: yes  CRITICAL CARE Performed by: Bryson Carbine   Total critical care time: 30 minutes  Critical care time was exclusive of separately billable procedures and treating other patients.  Critical care was necessary to treat or prevent imminent or life-threatening deterioration.  Critical care was time spent personally by me on the following activities: development of treatment plan with patient and/or surrogate as well as nursing, discussions with consultants, evaluation of patient's response to treatment, examination of patient, obtaining history from patient or surrogate, ordering and performing treatments and interventions, ordering and review of laboratory studies, ordering and review of radiographic studies, pulse  oximetry and re-evaluation of patient's condition.   Procedures   MEDICATIONS ORDERED IN ED: Medications  insulin aspart (novoLOG) injection 0-9 Units (has no administration in time range)  insulin aspart (novoLOG) injection 0-5 Units (has no administration in time range)  heparin ADULT infusion 100 units/mL (25000 units/250mL) (has no administration in time range)  iohexol  (OMNIPAQUE ) 350 MG/ML injection 75 mL (75 mLs Intravenous Contrast Given 01/03/24 1650)  LORazepam (ATIVAN) injection 1 mg (1 mg Intravenous Given 01/03/24 1814)     IMPRESSION / MDM / ASSESSMENT AND PLAN / ED COURSE  I reviewed the triage vital signs and the nursing notes. Patient's presentation is most consistent with acute presentation with potential threat to life or bodily function.  Patient presents with symptoms as detailed above, initially concerning primarily for PE given recent DVT diagnosis now with chest pain and shortness of breath with clear lungs.  ACS is also on the differential.  Pneumonia.  Chest x-ray without evidence of pneumonia, again clear to auscultation.  However on exam noted to be quite weak, more so on the left than on the right, question possible CVA versus ICH  Will send for CT head, CT angiography of the chest to evaluate for PE lab work reviewed overall unremarkable  Contacted by radiologist and notified of multiple pulmonary emboli, no evidence of right heart strain  CT head without acute abnormality  However nurses informing that she did fail her swallow screen, MRI ordered, I have consulted the hospitalist for admission      FINAL CLINICAL IMPRESSION(S) / ED DIAGNOSES   Final diagnoses:  Multiple subsegmental pulmonary emboli without acute cor pulmonale (HCC)     Rx / DC Orders   ED Discharge Orders     None        Note:  This document was prepared using Dragon voice recognition software and may include unintentional dictation errors.   Bryson Carbine,  MD 01/03/24 Roxanna Coppersmith

## 2024-01-03 NOTE — ED Notes (Signed)
 Pt taken to CT.

## 2024-01-03 NOTE — ED Notes (Signed)
 Advised nurse that patient has ready bed

## 2024-01-04 ENCOUNTER — Inpatient Hospital Stay

## 2024-01-04 ENCOUNTER — Inpatient Hospital Stay
Admit: 2024-01-04 | Discharge: 2024-01-04 | Disposition: A | Discharge status: Home / Self Care | Attending: Internal Medicine | Admitting: Internal Medicine

## 2024-01-04 DIAGNOSIS — I10 Essential (primary) hypertension: Secondary | ICD-10-CM

## 2024-01-04 DIAGNOSIS — I82452 Acute embolism and thrombosis of left peroneal vein: Secondary | ICD-10-CM

## 2024-01-04 DIAGNOSIS — I2699 Other pulmonary embolism without acute cor pulmonale: Secondary | ICD-10-CM

## 2024-01-04 DIAGNOSIS — E119 Type 2 diabetes mellitus without complications: Secondary | ICD-10-CM | POA: Diagnosis not present

## 2024-01-04 LAB — GLUCOSE, CAPILLARY
Glucose-Capillary: 100 mg/dL — ABNORMAL HIGH (ref 70–99)
Glucose-Capillary: 108 mg/dL — ABNORMAL HIGH (ref 70–99)
Glucose-Capillary: 154 mg/dL — ABNORMAL HIGH (ref 70–99)
Glucose-Capillary: 173 mg/dL — ABNORMAL HIGH (ref 70–99)
Glucose-Capillary: 176 mg/dL — ABNORMAL HIGH (ref 70–99)
Glucose-Capillary: 202 mg/dL — ABNORMAL HIGH (ref 70–99)
Glucose-Capillary: 93 mg/dL (ref 70–99)

## 2024-01-04 LAB — ECHOCARDIOGRAM COMPLETE
AR max vel: 1.38 cm2
AV Area VTI: 1.7 cm2
AV Area mean vel: 1.58 cm2
AV Mean grad: 3.5 mmHg
AV Peak grad: 5.8 mmHg
Ao pk vel: 1.2 m/s
Area-P 1/2: 5.27 cm2
Calc EF: 72.6 %
Height: 67 in
S' Lateral: 2.6 cm
Single Plane A2C EF: 78.6 %
Single Plane A4C EF: 68.7 %
Weight: 2416 [oz_av]

## 2024-01-04 LAB — URINE DRUG SCREEN, QUALITATIVE (ARMC ONLY)
Amphetamines, Ur Screen: NOT DETECTED
Barbiturates, Ur Screen: NOT DETECTED
Benzodiazepine, Ur Scrn: NOT DETECTED
Cannabinoid 50 Ng, Ur ~~LOC~~: NOT DETECTED
Cocaine Metabolite,Ur ~~LOC~~: NOT DETECTED
MDMA (Ecstasy)Ur Screen: NOT DETECTED
Methadone Scn, Ur: NOT DETECTED
Opiate, Ur Screen: NOT DETECTED
Phencyclidine (PCP) Ur S: NOT DETECTED
Tricyclic, Ur Screen: NOT DETECTED

## 2024-01-04 LAB — APTT
aPTT: 72 s — ABNORMAL HIGH (ref 24–36)
aPTT: 72 s — ABNORMAL HIGH (ref 24–36)

## 2024-01-04 LAB — HIV ANTIBODY (ROUTINE TESTING W REFLEX): HIV Screen 4th Generation wRfx: NONREACTIVE

## 2024-01-04 LAB — HEPARIN LEVEL (UNFRACTIONATED): Heparin Unfractionated: >1.1 [IU]/mL — ABNORMAL HIGH (ref 0.30–0.70)

## 2024-01-04 MED ORDER — BUDESON-GLYCOPYRROL-FORMOTEROL 160-9-4.8 MCG/ACT IN AERO
2.0000 | INHALATION_SPRAY | Freq: Two times a day (BID) | RESPIRATORY_TRACT | Status: DC
  Administered 2024-01-04 – 2024-01-05 (×3): 2 via RESPIRATORY_TRACT
  Filled 2024-01-04: qty 5.9

## 2024-01-04 MED ORDER — LORATADINE 10 MG PO TABS
5.0000 mg | ORAL_TABLET | Freq: Every evening | ORAL | Status: DC
  Administered 2024-01-04: 5 mg via ORAL
  Filled 2024-01-04: qty 1

## 2024-01-04 MED ORDER — VALSARTAN-HYDROCHLOROTHIAZIDE 320-12.5 MG PO TABS: 1.0000 | ORAL_TABLET | Freq: Every day | ORAL | Status: DC

## 2024-01-04 MED ORDER — ALBUTEROL SULFATE (2.5 MG/3ML) 0.083% IN NEBU: 2.5000 mg | INHALATION_SOLUTION | RESPIRATORY_TRACT | Status: DC | PRN

## 2024-01-04 MED ORDER — PANTOPRAZOLE SODIUM 40 MG PO TBEC
40.0000 mg | DELAYED_RELEASE_TABLET | Freq: Every day | ORAL | Status: DC
  Administered 2024-01-04 – 2024-01-05 (×2): 40 mg via ORAL
  Filled 2024-01-04 (×2): qty 1

## 2024-01-04 MED ORDER — SERTRALINE HCL 50 MG PO TABS
75.0000 mg | ORAL_TABLET | Freq: Every day | ORAL | Status: DC
  Administered 2024-01-04 – 2024-01-05 (×2): 75 mg via ORAL
  Filled 2024-01-04 (×2): qty 2

## 2024-01-04 MED ORDER — GABAPENTIN 100 MG PO CAPS
100.0000 mg | ORAL_CAPSULE | Freq: Two times a day (BID) | ORAL | Status: DC
  Administered 2024-01-04 – 2024-01-05 (×3): 100 mg via ORAL
  Filled 2024-01-04 (×3): qty 1

## 2024-01-04 MED ORDER — FLUTICASONE PROPIONATE 50 MCG/ACT NA SUSP: 1.0000 | Freq: Every day | NASAL | Status: DC | PRN

## 2024-01-04 MED ORDER — PIOGLITAZONE HCL 15 MG PO TABS: 15.0000 mg | ORAL_TABLET | Freq: Every day | ORAL | Status: DC

## 2024-01-04 MED ORDER — EMPAGLIFLOZIN 25 MG PO TABS: 25.0000 mg | ORAL_TABLET | Freq: Every day | ORAL | Status: DC

## 2024-01-04 MED ORDER — IPRATROPIUM-ALBUTEROL 0.5-2.5 (3) MG/3ML IN SOLN: 3.0000 mL | Freq: Four times a day (QID) | RESPIRATORY_TRACT | Status: DC | PRN

## 2024-01-04 NOTE — Consult Note (Signed)
 PHARMACY - ANTICOAGULATION CONSULT NOTE  Pharmacy Consult for heparin infusion Indication: DVT/PE  Allergies  Allergen Reactions   Codeine Itching, Anaphylaxis, Dermatitis, Other (See Comments), Palpitations and Shortness Of Breath    Other Reaction(s): Not available   Sulfamethoxazole-Trimethoprim Dermatitis   Patient Measurements: Height: 5\' 7"  (170.2 cm) Weight: 68.5 kg (151 lb) IBW/kg (Calculated) : 61.6 HEPARIN DW (KG): 68.5  Vital Signs: Temp: 97.9 F (36.6 C) (05/25 2301) Temp Source: Oral (05/25 2301) BP: 106/81 (05/25 2301) Pulse Rate: 83 (05/25 2301)  Labs: Recent Labs    01/03/24 1614 01/03/24 1748 01/03/24 2023 01/04/24 0130  HGB 15.2*  --   --   --   HCT 46.5*  --   --   --   PLT 163  --   --   --   APTT  --  22*  --  72*  LABPROT  --  13.4  --   --   INR  --  1.0  --   --   HEPARINUNFRC  --  >1.10*  --   --   CREATININE 0.78  --   --   --   TROPONINIHS 3  --  3  --    Estimated Creatinine Clearance: 71.8 mL/min (by C-G formula based on SCr of 0.78 mg/dL).  Medical History: Past Medical History:  Diagnosis Date   Asthma    Diabetes mellitus without complication (HCC)    DVT (deep venous thrombosis) (HCC) 12/31/2023   on eliquis   Hiatal hernia    Hypercholesteremia    Hypertension    Long COVID     Medications:  Apixaban started pack (start date 5/21; last dose today 5/25 at 8 am)  Assessment: 63 yo female admitted with increasing shortness of breath and chest tightness. Recently diagnosed with DVT on May/21 and started on Eliquis started pack. PMH includes long-haul COVID, hypertension, history of stroke with mild left-sided weakness, type II diabetes, and hyperlipidemia.  Hgb and plt appropriate to start heparin infusion. Baseline aPTT, INR and HL ordered prior to initiating heparin treatment. HL > 1.10, will monitor infusion with aPTT for now.  Goal of Therapy:  Heparin level 0.3-0.7 units/ml aPTT 66-102 seconds Monitor platelets by  anticoagulation protocol: Yes   Plan:  5/26: aPTT @ 72, therapeutic X 1 - Will continue pt on current rate and recheck aPTT and HL in 6 hrs Will check aPTT until HL correlating  Continue to monitor H&H and platelets  Kharizma Lesnick D 01/04/2024 2:05 AM

## 2024-01-04 NOTE — Progress Notes (Signed)
 OT Cancellation Note  Patient Details Name: Kristine Gilbert MRN: [id number] DOB: Jul 20, 1961   Cancelled Treatment:    Reason Eval/Treat Not Completed: Patient not medically ready; New PE and being treated with Heparin.  MD consulted; Hold therapy today and eval tomorrow.  Marcus Sewer, MS, OTR/L   [name] 01/04/2024, 9:36 AM

## 2024-01-04 NOTE — Plan of Care (Signed)

## 2024-01-04 NOTE — TOC Initial Note (Signed)
 Transition of Care [facility]) - Progression Note    Patient Details  Name: Kristine Gilbert MRN: [id number] Date of Birth: 1961-02-05  [facility] CM/SW Contact  Baird Bombard, RN Phone Number: 01/04/2024, 1:43 PM  Clinical Narrative:    SDOH positive for the following needs: food,housing, transportation, and utilities.   Spoke with patient. 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 PT/ OT evals pending patient medically stable to participate. She deos not have a preference of an agency if [facility] is needed.   Resources  for food, utilities, and transportation added to AVS.           Expected Discharge Plan and Services                                               Social Determinants of Health (SDOH) Interventions SDOH Screenings   Food Insecurity: Food Insecurity Present (01/04/2024)  Housing: High Risk (01/04/2024)  Transportation Needs: Unmet Transportation Needs (01/04/2024)  Utilities: At Risk (01/04/2024)  Financial Resource Strain: Low Risk  (12/29/2023)   Received from [facility]  Physical Activity: Inactive (03/09/2023)   Received from [facility]  Social Connections: Moderately Isolated (03/09/2023)   Received from [facility]  Stress: Stress Concern Present (03/09/2023)   Received from [facility]  Tobacco Use: Low Risk  (01/03/2024)  Health Literacy: Inadequate Health Literacy (03/09/2023)   Received from [facility]    Readmission Risk Interventions     No data to display

## 2024-01-04 NOTE — Consult Note (Signed)
 PHARMACY - ANTICOAGULATION CONSULT NOTE  Pharmacy Consult for heparin infusion Indication: DVT/PE  Allergies  Allergen Reactions   Codeine Itching, Anaphylaxis, Dermatitis, Other (See Comments), Palpitations and Shortness Of Breath    Other Reaction(s): Not available   Sulfamethoxazole-Trimethoprim Dermatitis   Patient Measurements: Height: 5\' 7"  (170.2 cm) Weight: 68.5 kg (151 lb) IBW/kg (Calculated) : 61.6 HEPARIN DW (KG): 68.5  Vital Signs: Temp: 97.8 F (36.6 C) (05/26 0724) Temp Source: Oral (05/26 0724) BP: 130/81 (05/26 0724) Pulse Rate: 80 (05/26 0724)  Labs: Recent Labs    01/03/24 1614 01/03/24 1748 01/03/24 2023 01/04/24 0130 01/04/24 0809  HGB 15.2*  --   --   --   --   HCT 46.5*  --   --   --   --   PLT 163  --   --   --   --   APTT  --  22*  --  72* 72*  LABPROT  --  13.4  --   --   --   INR  --  1.0  --   --   --   HEPARINUNFRC  --  >1.10*  --   --  >1.10*  CREATININE 0.78  --   --   --   --   TROPONINIHS 3  --  3  --   --    Estimated Creatinine Clearance: 71.8 mL/min (by C-G formula based on SCr of 0.78 mg/dL).  Medical History: Past Medical History:  Diagnosis Date   Asthma    Diabetes mellitus without complication (HCC)    DVT (deep venous thrombosis) (HCC) 12/31/2023   on eliquis   Hiatal hernia    Hypercholesteremia    Hypertension    Long COVID     Medications:  Apixaban started pack (start date 5/21; last dose today 5/25 at 8 am)  Assessment: 63 yo female admitted with increasing shortness of breath and chest tightness. Recently diagnosed with DVT on May/21 and started on Eliquis started pack. PMH includes long-haul COVID, hypertension, history of stroke with mild left-sided weakness, type II diabetes, and hyperlipidemia.  Hgb and plt appropriate to start heparin infusion. Baseline aPTT, INR and HL ordered prior to initiating heparin treatment. HL > 1.10, will monitor infusion with aPTT for now.  Goal of Therapy:  Heparin level  0.3-0.7 units/ml aPTT 66-102 seconds Monitor platelets by anticoagulation protocol: Yes   Plan:  5/26@0809 : aPTT @ 72, therapeutic X 2 - Will continue pt on current rate and recheck aPTT and HL tomorrow with morning labs Will check aPTT until HL correlating  Continue to monitor H&H and platelets  Adelie Croswell A Reegan Bouffard 01/04/2024 9:31 AM

## 2024-01-04 NOTE — Discharge Instructions (Signed)
 Food Resources  Agency Name: [facility] Agency Address: [address] [place], [place] [address] Phone: [phone] Website: [email or web address] Service(s) Offered: Housing services, self-sufficiency, congregate meal program, weatherization program, Event organiser program, emergency food assistance,  housing counseling, home ownership program, wheels - to work program.  [employer] free for 60 and older at various locations from [employer], Monday-Friday:  [employer], [address] [place], [phone] -[facility], [address], [place] [phone]  -[facility], [address], [place] [phone]  -[address], [address], [place], [phone]  Agency Name: [facility] Address: [phone] [address], [place], [place] [address] Phone: [phone] Website: [email or web address] Service(s) Offered: Home delivered hot, frozen, and emergency  meals. Grocery assistance program which matches  volunteers one-on-one with seniors unable to grocery shop  for themselves. Must be 60 years and older; less than 20  hours of in-home aide service, limited or no driving ability;  live alone or with someone with a disability; live in  [place].  Agency Name: [facility] [employer]) Address: [address], [place], [place] [address] Phone: [phone] Service(s) Offered: Food is served to shut-ins, homeless, elderly, and low income people in the community every Saturday (11:30 am-12:30 pm) and Sunday (12:30 pm-1:30pm). Volunteers also offer help and encouragement in seeking employment,  and spiritual guidance.  Agency Name: Department [facility] Social Services Address: [address] [place], [place] [address] Phone: [phone] Service(s) Offered: Child support services; child welfare services; food stamps; Medicaid; work first family assistance; and aid  with fuel,  rent, food and medicine.  Agency Name: Dietitian Address: [address], [place], [place] Phone: [phone] Website: [email or web address] Services Offered: Monday 10:00am-12:00, 8:00pm-9:00pm, and Friday 10:00am-12:00.  Agency Name: [facility] Address: [address], [place], [place] [address] Phone: [phone] Website: [email or web address] Service(s) Offered: Serves weekday meals, open from 11:30 am- 1:00 pm., and 6:30-7:30pm, Monday-Wednesday-Friday distributes food 3:30-6pm, Monday-Wednesday-Friday.  Agency Name: [facility] Address: [address], [place], [place] Phone: [phone] Website: [email or web address] Services Offered: Distributes food the 4th Saturday of the month, starting at 8:00 am  Agency Name: [facility] Address: [phone] [address] [place], [place] [address] Phone: [phone] Website: [email or web address] Service(s) Offered: Bread of life, weekly food pantry. Open Wednesdays from 10:00am-noon.  Agency Name: [facility] [employer] Address: [address] [place], [place], [place] Phone: [phone] Services Offered: Distributes food 9am-1pm, Monday-Thursday. Call for details.  Agency Name: [facility] Address: [address] [place], [place] [address] Phone: [phone] Website: [email or web address] Service(s) Offered: Games developer. Call for assistance.  Agency Name: [facility] Address: [address], [place], [place] [address] Phone: [phone] Service Offered: Emergency Food Pantry. Call for appointment.  Agency Name: [facility] Address: [address] [place], [place] [address] Phone: [phone] Website: [email or web address] Services Offered: Games developer. Call for details  Agency Name: [facility] Address: [address] [place], [place] Phone:  [phone] Website: [email or web address] Service(s) Offered: Emergency Food Pantry. Call for details.  Agency Name: Holiday representative Address: [address], [place], [place] [address] Phone: [phone] or [phone] Website: [email or web address] Service(s) Offered: Distribute food 9am-11:30 am, Tuesday-Friday, and 1-3:30pm, Monday-Friday. Food pantry Monday-Friday 1pm-3pm, fresh items, Mon.-Wed.-Fri.  Agency Name: [facility] Address: [address] [place], [place] [address] Phone: [phone] Website: [email or web address] Services Offered: Distribute food Tues and Sats from 9:00am-noon.  Closed 1st Saturday of each month. Call for details  Agency Name: [employer] Address: Adrianne Horn [facility] [address] [place] [address] Phone: [phone]  Services Offered: Delivers meals every Thursday    Transportation Resources for [employer] Area  Agency Name: [facility] Address: [address] [place], [place] [address] Phone: [phone] Email: [email or web address] Website: [email or web address] Service(s) Offered: Housing services, self-sufficiency, congregate meal program, weatherization program, Field seismologist program, emergency food assistance,  housing counseling, home ownership program, wheels-towork program.  Agency Name: [facility] [employer] [phone]) Address: [address] [place], [place] [address] Phone: [phone] Website: [email or web address] Service(s) Offered: Transportation for [employer], subscription and demand response; Dial-a-Ride for citizens 22 years of age or older.  Agency Name: Department of Social Services Address: [address] [place], [place] [address] Phone: [phone] Service(s) Offered: Child support services; child welfare services; food stamps; Medicaid; work first family assistance;  and aid with fuel,  rent, food and medicine, transportation assistance.  Agency Name: Disabled [employer] (DAV) Transportation  Network Phone: [phone] Service(s) Offered: Transports veterans to the [facility] medical center. Call  forty-eight hours in advance and leave the name, telephone  number, date, and time of appointment. Veteran will be  contacted by the driver the day before the appointment to  arrange a pick up point    [employer] ACTA currently provides door to door services. ACTA connects with PART daily for services to [facility]. ACTA also performs contract services to [employer] operates 27 vehicles, all but 3 mini-vans are equipped with lifts for special needs as well as the general public. ACTA drivers are each CDL certified and trained in First Aid and CPR. ACTA was established in 2002 by [employer]. An independent Industrial/product designer. ACTA operates via Cytogeneticist with required local 10% match funding from [place]. ACTA provides over 80,000 passenger trips each year, including [facility] and [employer] sites.  Call at least by 11 AM one business day prior to needing transportation  [employer].                      [place], [place] [address]     Office Hours: Monday-Friday  8 AM - 5 PM   Agency Name: [facility] Agency Address: [address] [place], [place] [address] Phone: [phone] Website: [email or web address] Service(s) Offered: Housing services, self-sufficiency, congregate meal program, and individual development account program.  Agency Name: [facility] [place] Address: [address], [place], [place] [address] Phone: [phone] Email: [email or web address] Website: [email or web address] Service(s) Offered: Housing the homeless, feeding the hungry,  Company secretary, job and education related services.  Agency Name: [facility] Address: [address], [place], [place] [address] Phone: [phone] Email: [email or web address] Service(s) Offered: Counseling, problem pregnancy, advocacy for Hispanics, limited emergency financial assistance.  Agency Name: Department of Social Services Address: [address] [place], [place] [address] Phone: [phone] Website: [email or web address] Service(s) Offered: Child support services; child welfare services; SNAP; Medicaid; work first family assistance; and aid with fuel,  rent, food and medicine.  Agency Name: Holiday representative Address: [address] [place], [place] [address] Phone: [phone] or [phone] Email: [email or web address] Service(s) Offered: Family services and transient assistance; emergency food, fuel, clothing, limited furniture, utilities; budget counseling, general counseling; give a kid a coat; thrift store; Christmas food  and toys. Utility assistance, food pantry, rental  assistance, life sustaining medicine

## 2024-01-04 NOTE — Progress Notes (Signed)
 PT Cancellation Note  Patient Details Name: Kristine Gilbert MRN: [id number] DOB: October 25, 1960   Cancelled Treatment:    Reason Eval/Treat Not Completed: Patient not medically ready PT orders received, chart reviewed. Pt recently admitted with new diagnosis of PE. PT recently started on IV Heparin (~12 hours ago). Consulted MD & will hold PT evaluation at this time, plan to see pt tomorrow AM.  Emaline Handsome, PT, DPT 01/04/24, 8:48 AM   Venetta Gill 01/04/2024, 8:46 AM

## 2024-01-04 NOTE — Plan of Care (Signed)
   Problem: Education: Goal: Knowledge of General Education information will improve Description Including pain rating scale, medication(s)/side effects and non-pharmacologic comfort measures Outcome: Progressing

## 2024-01-04 NOTE — Progress Notes (Addendum)
 [facility]  - [place] at [facility]   PATIENT NAME: Kristine Gilbert    MR#:  [id number]  DATE OF BIRTH:  1960-09-18  SUBJECTIVE:  no family at bedside during my evaluation. Laying comfortably. Complains of some substernal left-sided chest pain and right lower extremity pain. She also complains of pain on her left shoulder and left leg. No respiratory distress.    VITALS:  Blood pressure 130/81, pulse 80, temperature 97.8 F (36.6 C), temperature source Oral, resp. rate 16, height 5\' 7"  (1.702 m), weight 68.5 kg, SpO2 96%.  PHYSICAL EXAMINATION:   GENERAL:  63 y.o.-year-old patient with no acute distress.  LUNGS: Normal breath sounds bilaterally, no wheezing CARDIOVASCULAR: S1, S2 normal. No murmur   ABDOMEN: Soft, nontender, nondistended. Bowel sounds present.  EXTREMITIES: No  edema b/l.    NEUROLOGIC: nonfocal  patient is alert and awake   LABORATORY PANEL:  CBC Recent Labs  Lab 01/03/24 1614  WBC 5.7  HGB 15.2*  HCT 46.5*  PLT 163    Chemistries  Recent Labs  Lab 01/03/24 1614  NA 133*  K 4.3  CL 102  CO2 20*  GLUCOSE 143*  BUN 17  CREATININE 0.78  CALCIUM 8.8*   Cardiac Enzymes No results for input(s): "TROPONINI" in the last 168 hours. RADIOLOGY:  ECHOCARDIOGRAM COMPLETE Result Date: 01/04/2024    ECHOCARDIOGRAM REPORT   Patient Name:   Kristine Gilbert Date of Exam: 01/04/2024 Medical Rec #:  [id number]        Height:       67.0 in Accession #:    [id number]       Weight:       151.0 lb Date of Birth:  April 06, 1961        BSA:          1.795 m Patient Age:    63 years         BP:           111/69 mmHg Patient Gender: F                HR:           90 bpm. Exam Location:  [facility] Procedure: 2D Echo, Color Doppler and Cardiac Doppler (Both Spectral and Color            Flow Doppler were utilized during procedure). Indications:     I26.09 Pulmonary Embolus  History:         Patient has prior history of Echocardiogram examinations.                   Stroke and Asthma; Risk Factors:Diabetes, Hypertension and                  Dyslipidemia.  Sonographer:     L. Thornton-Maynard Referring Phys:  [id number] Diagnosing Phys: Antonette Batters MD IMPRESSIONS  1. Left ventricular ejection fraction, by estimation, is 60 to 65%. The left ventricle has normal function. The left ventricle has no regional wall motion abnormalities. Left ventricular diastolic parameters are consistent with Grade I diastolic dysfunction (impaired relaxation).  2. Right ventricular systolic function is normal. The right ventricular size is normal. There is normal pulmonary artery systolic pressure.  3. The mitral valve is normal in structure. Trivial mitral valve regurgitation.  4. The aortic valve is normal in structure. Aortic valve regurgitation is not visualized. FINDINGS  Left Ventricle: Left ventricular ejection fraction, by estimation, is 60 to 65%. The left  ventricle has normal function. The left ventricle has no regional wall motion abnormalities. Strain was performed and the global longitudinal strain is indeterminate. Global longitudinal strain performed but not reported based on interpreter judgement due to suboptimal tracking. The left ventricular internal cavity size was normal in size. There is no left ventricular hypertrophy. Left ventricular diastolic parameters are consistent with Grade I diastolic dysfunction (impaired relaxation). Right Ventricle: The right ventricular size is normal. No increase in right ventricular wall thickness. Right ventricular systolic function is normal. There is normal pulmonary artery systolic pressure. The tricuspid regurgitant velocity is 2.14 m/s, and  with an assumed right atrial pressure of 3 mmHg, the estimated right ventricular systolic pressure is 21.3 mmHg. Left Atrium: Left atrial size was normal in size. Right Atrium: Right atrial size was normal in size. Pericardium: There is no evidence of pericardial effusion. Mitral Valve: The  mitral valve is normal in structure. Trivial mitral valve regurgitation. Tricuspid Valve: The tricuspid valve is normal in structure. Tricuspid valve regurgitation is mild. Aortic Valve: The aortic valve is normal in structure. Aortic valve regurgitation is not visualized. Aortic valve mean gradient measures 3.5 mmHg. Aortic valve peak gradient measures 5.8 mmHg. Aortic valve area, by VTI measures 1.70 cm. Pulmonic Valve: The pulmonic valve was normal in structure. Pulmonic valve regurgitation is not visualized. Aorta: The ascending aorta was not well visualized. IAS/Shunts: No atrial level shunt detected by color flow Doppler. Additional Comments: 3D was performed not requiring image post processing on an independent workstation and was indeterminate.  LEFT VENTRICLE PLAX 2D LVIDd:         3.80 cm     Diastology LVIDs:         2.60 cm     LV e' medial:    6.74 cm/s LV PW:         1.00 cm     LV E/e' medial:  10.3 LV IVS:        1.00 cm     LV e' lateral:   8.05 cm/s LVOT diam:     1.60 cm     LV E/e' lateral: 8.6 LV SV:         38 LV SV Index:   21 LVOT Area:     2.01 cm  LV Volumes (MOD) LV vol d, MOD A2C: 26.4 ml LV vol d, MOD A4C: 32.3 ml LV vol s, MOD A2C: 5.7 ml LV vol s, MOD A4C: 10.1 ml LV SV MOD A2C:     20.7 ml LV SV MOD A4C:     32.3 ml LV SV MOD BP:      21.3 ml RIGHT VENTRICLE             IVC RV Basal diam:  2.70 cm     IVC diam: 0.90 cm RV Mid diam:    1.90 cm RV S prime:     12.50 cm/s TAPSE (M-mode): 1.8 cm LEFT ATRIUM             Index        RIGHT ATRIUM          Index LA diam:        2.50 cm 1.39 cm/m   RA Area:     8.16 cm LA Vol (A2C):   25.3 ml 14.10 ml/m  RA Volume:   13.80 ml 7.69 ml/m LA Vol (A4C):   21.6 ml 12.04 ml/m LA Biplane Vol: 23.8 ml 13.26 ml/m  AORTIC VALVE  PULMONIC VALVE AV Area (Vmax):    1.38 cm     PV Vmax:       0.73 m/s AV Area (Vmean):   1.58 cm     PV Peak grad:  2.1 mmHg AV Area (VTI):     1.70 cm AV Vmax:           120.00 cm/s AV Vmean:           85.700 cm/s AV VTI:            0.222 m AV Peak Grad:      5.8 mmHg AV Mean Grad:      3.5 mmHg LVOT Vmax:         82.10 cm/s LVOT Vmean:        67.500 cm/s LVOT VTI:          0.187 m LVOT/AV VTI ratio: 0.84  AORTA Ao Root diam: 3.00 cm Ao Asc diam:  3.20 cm MITRAL VALVE                TRICUSPID VALVE MV Area (PHT): 5.27 cm     TR Peak grad:   18.3 mmHg MV Decel Time: 144 msec     TR Vmax:        214.00 cm/s MV E velocity: 69.20 cm/s MV A velocity: 103.00 cm/s  SHUNTS MV E/A ratio:  0.67         Systemic VTI:  0.19 m                             Systemic Diam: 1.60 cm Antonette Batters MD Electronically signed by Antonette Batters MD Signature Date/Time: 01/04/2024/11:42:42 AM    Final    MR BRAIN WO CONTRAST Result Date: 01/03/2024 CLINICAL DATA:  Neuro deficit, acute, stroke suspected EXAM: MRI HEAD WITHOUT CONTRAST TECHNIQUE: Multiplanar, multiecho pulse sequences of the brain and surrounding structures were obtained without intravenous contrast. COMPARISON:  None Available. FINDINGS: Brain: No acute infarction, hemorrhage, hydrocephalus, extra-axial collection or mass lesion. Vascular: Normal flow voids. Skull and upper cervical spine: Normal marrow signal. Sinuses/Orbits: Negative. IMPRESSION: No evidence of acute intracranial abnormality. Electronically Signed   By: Stevenson Elbe M.D.   On: 01/03/2024 19:16   CT Angio Chest PE W and/or Wo Contrast Addendum Date: 01/03/2024 ADDENDUM REPORT: 01/03/2024 17:15 ADDENDUM: These results were called by telephone at the time of interpretation on 01/03/2024 at 5:15 pm to provider Bryson Carbine , who verbally acknowledged these results. Electronically Signed   By: Morgane  Naveau M.D.   On: 01/03/2024 17:15   Result Date: 01/03/2024 CLINICAL DATA:  Pulmonary embolism (PE) suspected, high prob. CP and dizziness since 2 days ago, has known DVT to L leg, on Eliquis. Also feels SOB. Pt slow to answer questions. Pt has unlabored breathing, skin dr Laurita Porta: CT  ANGIOGRAPHY CHEST WITH CONTRAST TECHNIQUE: Multidetector CT imaging of the chest was performed using the standard protocol during bolus administration of intravenous contrast. Multiplanar CT image reconstructions and MIPs were obtained to evaluate the vascular anatomy. RADIATION DOSE REDUCTION: This exam was performed according to the departmental dose-optimization program which includes automated exposure control, adjustment of the mA and/or kV according to patient size and/or use of iterative reconstruction technique. CONTRAST:  75mL OMNIPAQUE  IOHEXOL  350 MG/ML SOLN COMPARISON:  None Available. FINDINGS: Cardiovascular: Satisfactory opacification of the pulmonary arteries to the segmental level. Right lower lobe segmental subsegmental and right middle lobe segmental  pulmonary artery filling defects. The main pulmonary artery is normal in caliber. Normal heart size. No significant pericardial effusion. The thoracic aorta is normal in caliber. Trace atherosclerotic plaque of the thoracic aorta. No definite coronary artery calcifications. Trace mitral annular calcification. Mediastinum/Nodes: No enlarged mediastinal, hilar, or axillary lymph nodes. Thyroid gland, trachea, and esophagus demonstrate no significant findings. Lungs/Pleura: No focal consolidation. No pulmonary nodule. No pulmonary mass. No pleural effusion. No pneumothorax. Upper Abdomen: Status post cholecystectomy. Musculoskeletal: No chest wall abnormality. No suspicious lytic or blastic osseous lesions. No acute displaced fracture. Review of the MIP images confirms the above findings. IMPRESSION: Right lower lobe segmental/subsegmental and right middle lobe segmental pulmonary emboli. No associated right heart strain or pulmonary infarction. Electronically Signed: By: Morgane  Naveau M.D. On: 01/03/2024 17:11   CT HEAD WO CONTRAST Result Date: 01/03/2024 CLINICAL DATA:  Neuro deficit, acute, stroke suspected Pt to ED with son and daughter for CP  and dizziness since 2 days ago, has known DVT to L leg, on Eliquis. Also feels SOB. Pt slow to answer questions. Pt has unlabored breathing, skin EXAM: CT HEAD WITHOUT CONTRAST TECHNIQUE: Contiguous axial images were obtained from the base of the skull through the vertex without intravenous contrast. RADIATION DOSE REDUCTION: This exam was performed according to the departmental dose-optimization program which includes automated exposure control, adjustment of the mA and/or kV according to patient size and/or use of iterative reconstruction technique. COMPARISON:  CT head 04/07/2016 FINDINGS: Brain: No evidence of large-territorial acute infarction. No parenchymal hemorrhage. No mass lesion. No extra-axial collection. No mass effect or midline shift. No hydrocephalus. Basilar cisterns are patent. Vascular: No hyperdense vessel. Skull: No acute fracture or focal lesion. Sinuses/Orbits: Paranasal sinuses and mastoid air cells are clear. The orbits are unremarkable. Other: None. IMPRESSION: No acute intracranial abnormality. Electronically Signed   By: Morgane  Naveau M.D.   On: 01/03/2024 17:04   DG Chest 2 View Result Date: 01/03/2024 CLINICAL DATA:  Chest pain EXAM: CHEST - 2 VIEW COMPARISON:  X-ray 11/08/2022 and older. CT chest contrast 12/22/2023. FINDINGS: Underinflation. No consolidation, pneumothorax or effusion. No edema. Normal cardiopericardial silhouette. Overlapping cardiac leads. IMPRESSION: Underinflation.  No acute cardiopulmonary disease. Electronically Signed   By: Adrianna Horde M.D.   On: 01/03/2024 16:27    Assessment and Plan  Yevette Knust is a 63 y.o. female with medical history significant of long-haul COVID, hypertension, history of stroke with mild left-sided weakness, type II diabetes, hyperlipidemia, recent diagnosis of left peroneal DVT on 21st of May started on PO eliquis comes to the emergency room accompanied with son and daughter with increasing shortness of breath and chest  tightness. Patient also felt since her leg blood clot she has been feeling overall week and seems to fill her left-sided weakness is worsen.     Acute PE right subsegmental/right middle lobe history of left peroneal  DVT diagnosed May 21st 2025 -- IV heparin drip -- hold eliquis for now -- echo of the heart-- shows EF of 60 to 65%. -- will discuss case with vascular surgery if she needs any thrombectomy-- secure chat sent to Dr. Martina Sledge -- EKG this morning negative. Cardiac enzymes times two negative --Right LE US  doppler due to new pain   Left-sided weakness with history of stroke in the remote past -- MRI of the brain-- negative for stroke -- CT head no acute intracranial abnormality -- will consider neurology consultation if needed -- PT OT to see patient -- speech therapy to see  patient   Type II diabetes, with hyperlipidemia -- sliding scale insulin -- will resume metformin after 48 hours. Patient has CT contrast chest.   Hypercholesterolemia -- continue statins  HTN --valsartan/HCTZ   History of asthma/long-haul COVID -- patient [facility] pulmonology -- currently sats are 98% on room air. -- continue inhalers     Procedures: Family communication :none today Consults : vascular CODE STATUS: full DVT Prophylaxis : heparin drip Level of care: Telemetry Cardiac Status is: Inpatient Remains inpatient appropriate because: acute PE and DVT    TOTAL TIME TAKING CARE OF THIS PATIENT: 45 minutes.  >50% time spent on counselling and coordination of care  Note: This dictation was prepared with Dragon dictation along with smaller phrase technology. Any transcriptional errors that result from this process are unintentional.  Melvinia Stager M.D    [facility]   CC: Primary care physician; Rex Castor, MD

## 2024-01-04 NOTE — Evaluation (Addendum)
 Clinical/Bedside Swallow Evaluation Patient Details  Name: Kristine Gilbert MRN: [id number] Date of Birth: October 05, 1960  Today's Date: 01/04/2024 Time: SLP Start Time (ACUTE ONLY): 1045 SLP Stop Time (ACUTE ONLY): 1135 SLP Time Calculation (min) (ACUTE ONLY): 50 min  Past Medical History:  Past Medical History:  Diagnosis Date   Asthma    Diabetes mellitus without complication (HCC)    DVT (deep venous thrombosis) (HCC) 12/31/2023   on eliquis   Hiatal hernia    Hypercholesteremia    Hypertension    Long COVID    Past Surgical History:  Past Surgical History:  Procedure Laterality Date   CHOLECYSTECTOMY     HPI:  Pt is a 63 y.o. female with medical history significant of long-haul COVID, hypertension, history of stroke with mild left-sided weakness, type II diabetes, hyperlipidemia, recent diagnosis of left peroneal DVT on 21st of May started on PO eliquis comes to the emergency room accompanied with son and daughter with increasing shortness of breath and chest tightness.  Patient also felt since her leg blood clot she has been feeling overall week and seems to fill her left-sided weakness is worsen.  CT scan of the lung with contrast showed right sub segmental/middle lobe PE.  MRI: No evidence of acute intracranial abnormality.    Assessment / Plan / Recommendation  Clinical Impression   Pt seen for BSE today. Pt was awake, sitting up in bed talking w/ Son upon entering room. Pt stated she felt "much better" and had less "drainage" in her throat/head than yesterday. Pt A/O x4 and followed all instructions.  On RA, afebrile. WBC WNL.  Pt appears to present w/ functional oropharyngeal phase swallowing w/ No oropharyngeal phase dysphagia noted, No neuromuscular deficits noted. Pt consumed po trials w/ No overt, clinical s/s of aspiration during po trials.  Pt appears at reduced risk for aspiration following general aspiration precautions. However, pt does have challenging factors that  could impact her oropharyngeal swallowing to include fatigue/weakness, illness/hospitalization, and pulmonary issues(PE per MD/chart). These factors can increase risk for dysphagia as well as decreased oral intake overall.   During po trials, pt consumed all consistencies w/ no overt coughing, decline in vocal quality, or change in respiratory presentation during/post trials. O2 sats 98% when checked. Oral phase appeared [facility] w/ timely bolus management, mastication, and control of bolus propulsion for A-P transfer for swallowing. Oral clearing achieved w/ all trial consistencies -- moistened, soft foods given. OM Exam appeared [facility] w/ no unilateral weakness noted. Speech Clear. Pt fed self w/ min setup support.   Recommend a Regular consistency diet w/ well-Cut meats, moistened foods; Thin liquids -- monitor straw use in bed. Recommend general aspiration precautions, Pills WHOLE in Puree if easier for swallowing.  Education given on Pills in Puree; food consistencies and easy to eat options; general aspiration precautions to pt and Son. No further skilled ST services indicated currently. MD/NSG to reconsult if any new needs arise while admitted. Helped pt pick out food items for next meal, then called Dining services.  MD updated. Recommend Dietician f/u for support. SLP Visit Diagnosis: Dysphagia, unspecified (R13.10)    Aspiration Risk   (reduced following general precs.)    Diet Recommendation   Thin;Age appropriate regular = a Regular consistency diet w/ well-Cut meats, moistened foods; Thin liquids -- monitor straw use in bed. Recommend general aspiration precautions  Medication Administration: Whole meds with liquid (vs Whole in Puree if needed for ease of swallowing)    Other  Recommendations  Recommended Consults:  (Dietician) Oral Care Recommendations: Oral care BID;Patient independent with oral care    Recommendations for follow up therapy are one component of a multi-disciplinary discharge  planning process, led by the attending physician.  Recommendations may be updated based on patient status, additional functional criteria and insurance authorization.  Follow up Recommendations No SLP follow up      Assistance Recommended at Discharge  PRN  Functional Status Assessment Patient has had a recent decline in their functional status and demonstrates the ability to make significant improvements in function in a reasonable and predictable amount of time.  Frequency and Duration  (n/a)   (n/a)       Prognosis Prognosis for improved oropharyngeal function: Good Barriers/Prognosis Comment: deconditioning; PE      Swallow Study   General Date of Onset: 01/03/24 HPI: Pt is a 63 y.o. female with medical history significant of long-haul COVID, hypertension, history of stroke with mild left-sided weakness, type II diabetes, hyperlipidemia, recent diagnosis of left peroneal DVT on 21st of May started on PO eliquis comes to the emergency room accompanied with son and daughter with increasing shortness of breath and chest tightness.  Patient also felt since her leg blood clot she has been feeling overall week and seems to fill her left-sided weakness is worsen.  CT scan of the lung with contrast showed right sub segmental/middle lobe PE.  MRI: No evidence of acute intracranial abnormality. Type of Study: Bedside Swallow Evaluation Previous Swallow Assessment: none Diet Prior to this Study: NPO Temperature Spikes Noted: No (wbc 5.7) Respiratory Status: Room air History of Recent Intubation: No Behavior/Cognition: Alert;Cooperative;Pleasant mood Oral Cavity Assessment: Within Functional Limits Oral Care Completed by SLP: Yes Oral Cavity - Dentition: Adequate natural dentition Vision: Functional for self-feeding Self-Feeding Abilities: Able to feed self;Needs set up Patient Positioning: Upright in bed (min assist) Baseline Vocal Quality: Normal Volitional Cough: Strong Volitional  Swallow: Able to elicit    Oral/Motor/Sensory Function Overall Oral Motor/Sensory Function: Within functional limits   Ice Chips Ice chips: Not tested   Thin Liquid Thin Liquid: Within functional limits Presentation: Self Fed;Straw;Cup (10+ trials)    Nectar Thick Nectar Thick Liquid: Not tested   Honey Thick Honey Thick Liquid: Not tested   Puree Puree: Within functional limits Presentation: Self Fed;Spoon (8+ trials)   Solid     Solid: Within functional limits Presentation: Self Fed (6+ trials)         Darla Edward, MS, CCC-SLP Speech Language Pathologist Rehab Services; [facility] [place] [phone] (ascom) Petro [employer]ent 01/04/2024,1:28 PM

## 2024-01-04 NOTE — Consult Note (Addendum)
 Addendum:  She has a left scleral hemorrhage and left anterior chest pain costochondritis) worsened with compression.  I suspect these are both from coughing and sneezing from her chronic pulmonary issues.  The pain is opposite the side with the PE.  She admits she is better since starting the Eliquis.  She had some of the symptoms chronically pre-dating the DVT ultrasound so I suspect the CT diagnosis of PE and the actual development of the PE are not the same.  Await results of repeat LLE DVT study (OK to study both as 50% of DVT are Asx and she had a PE so the other side could have been the source.     [facility] Vascular Consult Note  MRN : [id number]  Kristine Gilbert is a 63 y.o. (13-May-1961) female who presents with chief complaint of  Chief Complaint  Patient presents with   Chest Pain   DVT   Dizziness   Shortness of Breath  .  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  Started on Eliquis and was compliant.  Developed chest pain and came to ER where CT-PE showed a right middle and lower lobe PE (01/03/2024).  Admitted for heparin and asked by Hospitalist about ? Eliquis failure and ? Thrombectomy.  Current Facility-Administered Medications  Medication Dose Route Frequency Provider Last Rate Last Admin   acetaminophen (TYLENOL) tablet 650 mg  650 mg Oral Q6H PRN Patel, Sona, MD   650 mg at 01/04/24 1215   Or   acetaminophen (TYLENOL) suppository 650 mg  650 mg Rectal Q6H PRN Patel, Sona, MD       albuterol  (PROVENTIL ) (2.5 MG/3ML) 0.083% nebulizer solution 2.5 mg  2.5 mg Nebulization Q4H PRN Duncan, Hazel V, MD       atorvastatin (LIPITOR) tablet 40 mg  40 mg Oral Daily Patel, Sona, MD   40 mg at 01/04/24 1208   budesonide-glycopyrrolate-formoterol (BREZTRI) 160-9-4.8 MCG/ACT inhaler 2 puff  2 puff Inhalation BID Patel, Sona, MD       cholecalciferol  (VITAMIN D3) 25 MCG (1000 UNIT) tablet 1,000 Units  1,000 Units Oral Daily Patel, Sona, MD   1,000 Units at 01/04/24 1208   cyanocobalamin (VITAMIN B12) tablet 1,000 mcg  1,000 mcg Oral Daily Patel, Sona, MD   1,000 mcg at 01/04/24 1209   fluticasone (FLONASE) 50 MCG/ACT nasal spray 1 spray  1 spray Each Nare Daily PRN Patel, Sona, MD       gabapentin (NEURONTIN) capsule 100 mg  100 mg Oral BID Patel, Sona, MD   100 mg at 01/04/24 1208   heparin ADULT infusion 100 units/mL (25000 units/250mL)  1,200 Units/hr Intravenous Continuous Melvinia Stager, MD 12 mL/hr at 01/04/24 0800 1,200 Units/hr at 01/04/24 0800   insulin aspart (novoLOG) injection 0-5 Units  0-5 Units Subcutaneous QHS Duncan, Hazel V, MD       insulin aspart (novoLOG) injection 0-9 Units  0-9 Units Subcutaneous Q4H Duncan, Hazel V, MD   2 Units at 01/04/24 1213   ipratropium-albuterol  (DUONEB) 0.5-2.5 (3) MG/3ML nebulizer solution 3 mL  3 mL Inhalation QID PRN Patel, Sona, MD       loratadine (CLARITIN) tablet 5 mg  5 mg Oral QPM Patel, Sona, MD       nortriptyline (PAMELOR) capsule 20 mg  20 mg Oral QHS Patel, Sona, MD       ondansetron (  ZOFRAN) tablet 4 mg  4 mg Oral Q6H PRN Patel, Sona, MD       Or   ondansetron (ZOFRAN) injection 4 mg  4 mg Intravenous Q6H PRN Patel, Sona, MD       pantoprazole (PROTONIX) EC tablet 40 mg  40 mg Oral Daily Patel, Sona, MD   40 mg at 01/04/24 1210   senna-docusate (Senokot-S) tablet 1 tablet  1 tablet Oral QHS PRN Patel, Sona, MD       sertraline (ZOLOFT) tablet 75 mg  75 mg Oral Daily Patel, Sona, MD   75 mg at 01/04/24 1209    Past Medical History:  Diagnosis Date   Asthma    Diabetes mellitus without complication (HCC)    DVT (deep venous thrombosis) (HCC) 12/31/2023   on eliquis   Hiatal hernia    Hypercholesteremia    Hypertension    Long COVID     Past Surgical History:  Procedure Laterality Date   CHOLECYSTECTOMY      Social History Social History   Tobacco Use   Smoking  status: Never   Smokeless tobacco: Never  Vaping Use   Vaping status: Never Used  Substance Use Topics   Alcohol use: No   Drug use: Not Currently    Family History Family History  Problem Relation Age of Onset   Diabetes Mother    Cancer Mother    Diabetes Father    Throat cancer Father     Allergies  Allergen Reactions   Codeine Itching, Anaphylaxis, Dermatitis, Other (See Comments), Palpitations and Shortness Of Breath    Other Reaction(s): Not available   Sulfamethoxazole-Trimethoprim Dermatitis     REVIEW OF SYSTEMS (Negative unless checked)  Constitutional: [] Weight loss  [] Fever  [] Chills Cardiac: [x] Chest pain   [] Chest pressure   [] Palpitations   [x] Shortness of breath when laying flat   [] Shortness of breath at rest   [] Shortness of breath with exertion. Vascular:  [] Pain in legs with walking   [x] Pain in legs at rest   [] Pain in legs when laying flat   [] Claudication   [] Pain in feet when walking  [] Pain in feet at rest  [] Pain in feet when laying flat   [x] History of DVT   [] Phlebitis   [] Swelling in legs   [] Varicose veins   [] Non-healing ulcers Pulmonary:   [] Uses home oxygen   [] Productive cough   [] Hemoptysis   [] Wheeze  [] COPD   [x] Asthma Neurologic:  [] Dizziness  [] Blackouts   [] Seizures   [x] History of stroke   [] History of TIA  [] Aphasia   [] Temporary blindness   [] Dysphagia   [] Weakness or numbness in arms   [x] Weakness or numbness in legs Musculoskeletal:  [] Arthritis   [] Joint swelling   [] Joint pain   [] Low back pain Hematologic:  [] Easy bruising  [] Easy bleeding   [] Hypercoagulable state   [] Anemic  [] Hepatitis Gastrointestinal:  [] Blood in stool   [] Vomiting blood  [] Gastroesophageal reflux/heartburn   [] Difficulty swallowing. Genitourinary:  [] Chronic kidney disease   [] Difficult urination  [] Frequent urination  [] Burning with urination   [] Blood in urine Skin:  [] Rashes   [] Ulcers   [] Wounds Psychological:  [] History of anxiety   []  History of major  depression.  Physical Examination  Vitals:   01/03/24 1948 01/03/24 2301 01/04/24 0542 01/04/24 0724  BP: 128/83 106/81 111/69 130/81  Pulse: 82 83 80 80  Resp: 16 18 18 16   Temp: 98.3 F (36.8 C) 97.9 F (36.6 C) 98.2 F (36.8 C) 97.8  F (36.6 C)  TempSrc:  Oral  Oral  SpO2: 98% 94% 99% 96%  Weight:      Height:       Body mass index is 23.65 kg/m. Gen:  WD/WN, NAD Head: [place]/AT, No temporalis wasting. Prominent temp pulse not noted. Ear/Nose/Throat: Hearing grossly intact, nares w/o erythema or drainage, oropharynx w/o Erythema/Exudate Eyes: Sclera non-icteric, conjunctiva clear Neck: Trachea midline.  No JVD.  Pulmonary:  Good air movement, respirations not labored, equal bilaterally.  Cardiac: RRR, normal S1, S2. Vascular:  Vessel Right Left  Radial Palpable Palpable  Ulnar Palpable Palpable  Brachial Palpable Palpable  Carotid Palpable, without bruit Palpable, without bruit  Aorta Not palpable N/A  Femoral Palpable Palpable  Popliteal Palpable Palpable  PT Palpable Palpable  DP Palpable Palpable   Gastrointestinal: soft, non-tender/non-distended. No guarding/reflex.  Musculoskeletal: M/S 5/5 throughout.  Extremities without ischemic changes.  No deformity or atrophy. No edema. Neurologic: Sensation grossly intact in extremities.  Symmetrical.  Speech is fluent. Motor exam as listed above. Psychiatric: Judgment intact, Mood & affect appropriate for pt's clinical situation. Dermatologic: No rashes or ulcers noted.  No cellulitis or open wounds. Lymph : No Cervical, Axillary, or Inguinal lymphadenopathy.     CBC Lab Results  Component Value Date   WBC 5.7 01/03/2024   HGB 15.2 (H) 01/03/2024   HCT 46.5 (H) 01/03/2024   MCV 91.2 01/03/2024   PLT 163 01/03/2024    BMET    Component Value Date/Time   NA 133 (L) 01/03/2024 1614   K 4.3 01/03/2024 1614   CL 102 01/03/2024 1614   CO2 20 (L) 01/03/2024 1614   GLUCOSE 143 (H) 01/03/2024 1614   BUN 17  01/03/2024 1614   CREATININE 0.78 01/03/2024 1614   CALCIUM 8.8 (L) 01/03/2024 1614   GFRNONAA >60 01/03/2024 1614   GFRAA >60 04/07/2016 2201   Estimated Creatinine Clearance: 71.8 mL/min (by C-G formula based on SCr of 0.78 mg/dL).  COAG Lab Results  Component Value Date   INR 1.0 01/03/2024    Radiology ECHOCARDIOGRAM COMPLETE Result Date: 01/04/2024    ECHOCARDIOGRAM REPORT   Patient Name:   Kristine Gilbert Date of Exam: 01/04/2024 Medical Rec #:  [id number]        Height:       67.0 in Accession #:    [id number]       Weight:       151.0 lb Date of Birth:  [birth year]        BSA:          1.795 m Patient Age:    62 years         BP:           111/69 mmHg Patient Gender: F                HR:           90 bpm. Exam Location:  [facility] Procedure: 2D Echo, Color Doppler and Cardiac Doppler (Both Spectral and Color            Flow Doppler were utilized during procedure). Indications:     I26.09 Pulmonary Embolus  History:         Patient has prior history of Echocardiogram examinations.                  Stroke and Asthma; Risk Factors:Diabetes, Hypertension and                  Dyslipidemia.  Sonographer:     L. Thornton-Maynard Referring Phys:  [id number] SONA PATEL Diagnosing Phys: Antonette Batters MD IMPRESSIONS  1. Left ventricular ejection fraction, by estimation, is 60 to 65%. The left ventricle has normal function. The left ventricle has no regional wall motion abnormalities. Left ventricular diastolic parameters are consistent with Grade I diastolic dysfunction (impaired relaxation).  2. Right ventricular systolic function is normal. The right ventricular size is normal. There is normal pulmonary artery systolic pressure.  3. The mitral valve is normal in structure. Trivial mitral valve regurgitation.  4. The aortic valve is normal in structure. Aortic valve regurgitation is not visualized. FINDINGS  Left Ventricle: Left ventricular ejection fraction, by estimation, is 60 to 65%. The left ventricle has  normal function. The left ventricle has no regional wall motion abnormalities. Strain was performed and the global longitudinal strain is indeterminate. Global longitudinal strain performed but not reported based on interpreter judgement due to suboptimal tracking. The left ventricular internal cavity size was normal in size. There is no left ventricular hypertrophy. Left ventricular diastolic parameters are consistent with Grade I diastolic dysfunction (impaired relaxation). Right Ventricle: The right ventricular size is normal. No increase in right ventricular wall thickness. Right ventricular systolic function is normal. There is normal pulmonary artery systolic pressure. The tricuspid regurgitant velocity is 2.14 m/s, and  with an assumed right atrial pressure of 3 mmHg, the estimated right ventricular systolic pressure is 21.3 mmHg. Left Atrium: Left atrial size was normal in size. Right Atrium: Right atrial size was normal in size. Pericardium: There is no evidence of pericardial effusion. Mitral Valve: The mitral valve is normal in structure. Trivial mitral valve regurgitation. Tricuspid Valve: The tricuspid valve is normal in structure. Tricuspid valve regurgitation is mild. Aortic Valve: The aortic valve is normal in structure. Aortic valve regurgitation is not visualized. Aortic valve mean gradient measures 3.5 mmHg. Aortic valve peak gradient measures 5.8 mmHg. Aortic valve area, by VTI measures 1.70 cm. Pulmonic Valve: The pulmonic valve was normal in structure. Pulmonic valve regurgitation is not visualized. Aorta: The ascending aorta was not well visualized. IAS/Shunts: No atrial level shunt detected by color flow Doppler. Additional Comments: 3D was performed not requiring image post processing on an independent workstation and was indeterminate.  LEFT VENTRICLE PLAX 2D LVIDd:         3.80 cm     Diastology LVIDs:         2.60 cm     LV e' medial:    6.74 cm/s LV PW:         1.00 cm     LV E/e'  medial:  10.3 LV IVS:        1.00 cm     LV e' lateral:   8.05 cm/s LVOT diam:     1.60 cm     LV E/e' lateral: 8.6 LV SV:         38 LV SV Index:   21 LVOT Area:     2.01 cm  LV Volumes (MOD) LV vol d, MOD A2C: 26.4 ml LV vol d, MOD A4C: 32.3 ml LV vol s, MOD A2C: 5.7 ml LV vol s, MOD A4C: 10.1 ml LV SV MOD A2C:     20.7 ml LV SV MOD A4C:     32.3 ml LV SV MOD BP:      21.3 ml RIGHT VENTRICLE             IVC RV Basal diam:  2.70  cm     IVC diam: 0.90 cm RV Mid diam:    1.90 cm RV S prime:     12.50 cm/s TAPSE (M-mode): 1.8 cm LEFT ATRIUM             Index        RIGHT ATRIUM          Index LA diam:        2.50 cm 1.39 cm/m   RA Area:     8.16 cm LA Vol (A2C):   25.3 ml 14.10 ml/m  RA Volume:   13.80 ml 7.69 ml/m LA Vol (A4C):   21.6 ml 12.04 ml/m LA Biplane Vol: 23.8 ml 13.26 ml/m  AORTIC VALVE                    PULMONIC VALVE AV Area (Vmax):    1.38 cm     PV Vmax:       0.73 m/s AV Area (Vmean):   1.58 cm     PV Peak grad:  2.1 mmHg AV Area (VTI):     1.70 cm AV Vmax:           120.00 cm/s AV Vmean:          85.700 cm/s AV VTI:            0.222 m AV Peak Grad:      5.8 mmHg AV Mean Grad:      3.5 mmHg LVOT Vmax:         82.10 cm/s LVOT Vmean:        67.500 cm/s LVOT VTI:          0.187 m LVOT/AV VTI ratio: 0.84  AORTA Ao Root diam: 3.00 cm Ao Asc diam:  3.20 cm MITRAL VALVE                TRICUSPID VALVE MV Area (PHT): 5.27 cm     TR Peak grad:   18.3 mmHg MV Decel Time: 144 msec     TR Vmax:        214.00 cm/s MV E velocity: 69.20 cm/s MV A velocity: 103.00 cm/s  SHUNTS MV E/A ratio:  0.67         Systemic VTI:  0.19 m                             Systemic Diam: 1.60 cm Antonette Batters MD Electronically signed by Antonette Batters MD Signature Date/Time: 01/04/2024/11:42:42 AM    Final    MR BRAIN WO CONTRAST Result Date: 01/03/2024 CLINICAL DATA:  Neuro deficit, acute, stroke suspected EXAM: MRI HEAD WITHOUT CONTRAST TECHNIQUE: Multiplanar, multiecho pulse sequences of the brain and surrounding  structures were obtained without intravenous contrast. COMPARISON:  None Available. FINDINGS: Brain: No acute infarction, hemorrhage, hydrocephalus, extra-axial collection or mass lesion. Vascular: Normal flow voids. Skull and upper cervical spine: Normal marrow signal. Sinuses/Orbits: Negative. IMPRESSION: No evidence of acute intracranial abnormality. Electronically Signed   By: Stevenson Elbe M.D.   On: 01/03/2024 19:16   CT Angio Chest PE W and/or Wo Contrast Addendum Date: 01/03/2024 ADDENDUM REPORT: 01/03/2024 17:15 ADDENDUM: These results were called by telephone at the time of interpretation on 01/03/2024 at 5:15 pm to provider Bryson Carbine , who verbally acknowledged these results. Electronically Signed   By: Morgane  Naveau M.D.   On: 01/03/2024 17:15   Result Date: 01/03/2024 CLINICAL DATA:  Pulmonary embolism (PE) suspected, high prob. CP and dizziness since 2 days ago, has known DVT to L leg, on Eliquis. Also feels SOB. Pt slow to answer questions. Pt has unlabored breathing, skin dr Laurita Porta: CT ANGIOGRAPHY CHEST WITH CONTRAST TECHNIQUE: Multidetector CT imaging of the chest was performed using the standard protocol during bolus administration of intravenous contrast. Multiplanar CT image reconstructions and MIPs were obtained to evaluate the vascular anatomy. RADIATION DOSE REDUCTION: This exam was performed according to the departmental dose-optimization program which includes automated exposure control, adjustment of the mA and/or kV according to patient size and/or use of iterative reconstruction technique. CONTRAST:  75mL OMNIPAQUE  IOHEXOL  350 MG/ML SOLN COMPARISON:  None Available. FINDINGS: Cardiovascular: Satisfactory opacification of the pulmonary arteries to the segmental level. Right lower lobe segmental subsegmental and right middle lobe segmental pulmonary artery filling defects. The main pulmonary artery is normal in caliber. Normal heart size. No significant pericardial effusion. The  thoracic aorta is normal in caliber. Trace atherosclerotic plaque of the thoracic aorta. No definite coronary artery calcifications. Trace mitral annular calcification. Mediastinum/Nodes: No enlarged mediastinal, hilar, or axillary lymph nodes. Thyroid gland, trachea, and esophagus demonstrate no significant findings. Lungs/Pleura: No focal consolidation. No pulmonary nodule. No pulmonary mass. No pleural effusion. No pneumothorax. Upper Abdomen: Status post cholecystectomy. Musculoskeletal: No chest wall abnormality. No suspicious lytic or blastic osseous lesions. No acute displaced fracture. Review of the MIP images confirms the above findings. IMPRESSION: Right lower lobe segmental/subsegmental and right middle lobe segmental pulmonary emboli. No associated right heart strain or pulmonary infarction. Electronically Signed: By: Morgane  Naveau M.D. On: 01/03/2024 17:11   CT HEAD WO CONTRAST Result Date: 01/03/2024 CLINICAL DATA:  Neuro deficit, acute, stroke suspected Pt to ED with son and daughter for CP and dizziness since 2 days ago, has known DVT to L leg, on Eliquis. Also feels SOB. Pt slow to answer questions. Pt has unlabored breathing, skin EXAM: CT HEAD WITHOUT CONTRAST TECHNIQUE: Contiguous axial images were obtained from the base of the skull through the vertex without intravenous contrast. RADIATION DOSE REDUCTION: This exam was performed according to the departmental dose-optimization program which includes automated exposure control, adjustment of the mA and/or kV according to patient size and/or use of iterative reconstruction technique. COMPARISON:  CT head 04/07/2016 FINDINGS: Brain: No evidence of large-territorial acute infarction. No parenchymal hemorrhage. No mass lesion. No extra-axial collection. No mass effect or midline shift. No hydrocephalus. Basilar cisterns are patent. Vascular: No hyperdense vessel. Skull: No acute fracture or focal lesion. Sinuses/Orbits: Paranasal sinuses and  mastoid air cells are clear. The orbits are unremarkable. Other: None. IMPRESSION: No acute intracranial abnormality. Electronically Signed   By: Morgane  Naveau M.D.   On: 01/03/2024 17:04   DG Chest 2 View Result Date: 01/03/2024 CLINICAL DATA:  Chest pain EXAM: CHEST - 2 VIEW COMPARISON:  X-ray 11/08/2022 and older. CT chest contrast 12/22/2023. FINDINGS: Underinflation. No consolidation, pneumothorax or effusion. No edema. Normal cardiopericardial silhouette. Overlapping cardiac leads. IMPRESSION: Underinflation.  No acute cardiopulmonary disease. Electronically Signed   By: Adrianna Horde M.D.   On: 01/03/2024 16:27   US  Venous Img Lower Unilateral Left (DVT) Result Date: 12/30/2023 CLINICAL DATA:  Lower extremity pain EXAM: LEFT LOWER EXTREMITY VENOUS DOPPLER ULTRASOUND TECHNIQUE: Xoey Warmoth-scale sonography with compression, as well as color and duplex ultrasound, were performed to evaluate the deep venous system(s) from the level of the common femoral vein through the popliteal and proximal calf veins. COMPARISON:  None Available. FINDINGS: VENOUS Normal compressibility  of the common femoral, superficial femoral, and popliteal veins. Visualized portions of profunda femoral vein and great saphenous vein unremarkable. A left peroneal vein below the knee is identified which is noncompressible. OTHER None. Limitations: none IMPRESSION: 1. DVT in a left peroneal vein below the knee. These results will be called to the ordering clinician or representative by the Radiologist Assistant, and communication documented in the PACS or [employer]. Electronically Signed   By: Reagan Camera M.D.   On: 12/30/2023 16:38   CT CHEST W CONTRAST Result Date: 12/22/2023 CLINICAL DATA:  Chest pain and shortness of breath. EXAM: CT CHEST WITH CONTRAST TECHNIQUE: Multidetector CT imaging of the chest was performed during intravenous contrast administration. RADIATION DOSE REDUCTION: This exam was performed according to the  departmental dose-optimization program which includes automated exposure control, adjustment of the mA and/or kV according to patient size and/or use of iterative reconstruction technique. CONTRAST:  75mL OMNIPAQUE  IOHEXOL  300 MG/ML  SOLN COMPARISON:  Chest x-ray 11/08/2022 FINDINGS: Cardiovascular: No significant vascular findings. Normal heart size. No pericardial effusion. Mediastinum/Nodes: No enlarged mediastinal, hilar, or axillary lymph nodes. Thyroid gland, trachea, and esophagus demonstrate no significant findings. Small hiatal hernia. Lungs/Pleura: The lungs are clear of an acute process. No infiltrates, edema or effusions. No pulmonary lesions or pulmonary nodules. No pleural nodules. The tracheobronchial tree is unremarkable. No interstitial lung disease or bronchiectasis. Upper Abdomen: Status post cholecystectomy. No biliary dilatation. 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 Musculoskeletal: No chest wall abnormality. No acute or significant osseous findings. IMPRESSION: 1. No acute pulmonary findings, pulmonary lesions or nodules. 2. No mediastinal or hilar mass or adenopathy. 3. Small hiatal hernia. 4. 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 Electronically Signed   By: Marrian Siva M.D.   On: 12/22/2023 14:14      Assessment/Plan 1. No indication for pulmonary thrombectomy.  Good saturation on RA and normal hemodynamics.  Negative blood markers.  Normal ECHO with nl RV fxn and PA pressures.  CTA shows limited PE and no RV stain parameters. 2. Eliquis failure vs inevitable as PE and DVT occurred so close together there was no chance for Eliquis to affect natural history vs true Eliquis failure.  If DVT has progressed on the pending study they Eliquis failure.  Otherwise just a guess.  Ask Hematology to comment.    Jerri Morale, MD  01/04/2024 12:28 PM    This note was  created with Dragon medical transcription system.  Any error is purely unintentional

## 2024-01-04 NOTE — Progress Notes (Signed)
 RN made Dr. Lydia Sams aware that patient is complaining of squeezing chest pain to the left and left arm pain. Patient also complains of right leg and foot pain. Dr. Lydia Sams came to bedside while RN was obtaining EKG. MD assessed patient and discussed her pain. Patient denies shortness of breath, A&Ox4. Patient agrees to take tylenol, but states "I don't like to take any extra medicine even pain medicine."

## 2024-01-05 LAB — GLUCOSE, CAPILLARY
Glucose-Capillary: 216 mg/dL — ABNORMAL HIGH (ref 70–99)
Glucose-Capillary: 120 mg/dL — ABNORMAL HIGH (ref 70–99)
Glucose-Capillary: 132 mg/dL — ABNORMAL HIGH (ref 70–99)

## 2024-01-05 LAB — APTT: aPTT: 107 s — ABNORMAL HIGH (ref 24–36)

## 2024-01-05 LAB — HEPARIN LEVEL (UNFRACTIONATED): Heparin Unfractionated: >1.1 [IU]/mL — ABNORMAL HIGH (ref 0.30–0.70)

## 2024-01-05 MED ORDER — APIXABAN 5 MG PO TABS
10.0000 mg | ORAL_TABLET | Freq: Two times a day (BID) | ORAL | Status: DC
  Administered 2024-01-05: 10 mg via ORAL
  Filled 2024-01-05: qty 2

## 2024-01-05 MED ORDER — APIXABAN 5 MG PO TABS: 5.0000 mg | ORAL_TABLET | Freq: Two times a day (BID) | ORAL | Status: DC

## 2024-01-05 NOTE — Discharge Summary (Signed)
 Physician Discharge Summary   Patient: Kristine Gilbert MRN: [id number] DOB: 1961/02/16  Admit date:     01/03/2024  Discharge date: 01/05/24  Discharge Physician: Melvinia Stager   PCP: Rex Castor, MD   Recommendations at discharge:   follow-up PCP in 1 to 2 weeks patient will follow-up with Dr. Wilhelmenia Harada hematology oncology. Referral sent for new onset DVT/PE F/u Dr Vonna Guardian in 2-3 weeks  Discharge Diagnoses: Principal Problem:   Acute pulmonary embolism (HCC) Active Problems:   Acute deep vein thrombosis (DVT) of left peroneal vein (HCC)   Type 2 diabetes mellitus without complication (HCC)   Primary hypertension  Kristine Gilbert is a 63 y.o. female with medical history significant of long-haul COVID, hypertension, history of stroke with mild left-sided weakness, type II diabetes, hyperlipidemia, recent diagnosis of left peroneal DVT on 21st of May started on PO eliquis comes to the emergency room accompanied with son and daughter with increasing shortness of breath and chest tightness. Patient also felt since her leg blood clot she has been feeling overall week and seems to fill her left-sided weakness is worsen.     Acute PE right subsegmental/right middle lobe history of left peroneal DVT diagnosed May 21st 2025 -- IV heparin drip -- hold eliquis  -- echo of the heart-- shows EF of 60 to 65%.No right heart strain -- will discuss case with vascular surgery if she needs any thrombectomy-- secure chat sent to Dr. Verta Goring Dr Staci Dykes indication for Mechanical thrombectomy -- EKG this morning negative. Cardiac enzymes times two negative --Repeat Bilateral LE venous doppler--Stable left peroneal DVT  and no DVT right LE --Case d/w Dr Martina Sledge and Dr Wilhelmenia Harada (hematology) and with given above w/u will cont Po eliquis with out pt f/u Dr Wilhelmenia Harada --pt understands risks of bleeding -- she ambulated with physical therapy and maintain sats above 90%. Mild chest pain on the left side which improved with rest.  No wheezing no respiratory distress noted. --pt's sister had questions about thrombectomy, and treatment plan--discussed with her in detail on the phone.   Left-sided weakness with history of stroke in the remote past -- MRI of the brain-- negative for stroke -- CT head no acute intracranial abnormality -- will consider neurology consultation if needed -- PT OT--recommends HHPT -- speech therapy to see patient   Type II diabetes, with hyperlipidemia -- sliding scale insulin -- Resume home diabetes regimen.  Hypercholesterolemia -- continue statins   HTN --valsartan/HCTZ   History of asthma/long-haul COVID -- patient follows with [facility] pulmonology -- currently sats are 98% on room air. -- continue inhalers, nebs as before  Left conjunctival ingestion--suspected due to cough/Asthma --cont monitor     Family communication :sister Lynnie Saucier on the phone Consults : vascular, Hematology CODE STATUS: full DVT Prophylaxis : heparin drip       Disposition: Home health Diet recommendation:  Discharge Diet Orders (From admission, onward)     Start     Ordered   01/05/24 0000  Diet - low sodium heart healthy        01/05/24 1113           Cardiac and Carb modified diet DISCHARGE MEDICATION: Allergies as of 01/05/2024       Reactions   Codeine Itching, Anaphylaxis, Dermatitis, Other (See Comments), Palpitations, Shortness Of Breath   Other Reaction(s): Not available   Sulfamethoxazole-trimethoprim Dermatitis        Medication List     STOP taking these medications    aspirin 81 MG  chewable tablet   nortriptyline 10 MG capsule Commonly known as: PAMELOR       TAKE these medications    albuterol  108 (90 Base) MCG/ACT inhaler Commonly known as: VENTOLIN  HFA Inhale 2 puffs into the lungs every 6 (six) hours as needed for wheezing or shortness of breath.   apixaban 5 MG Tabs tablet Commonly known as: ELIQUIS Take 10 mg by mouth 2 (two) times daily.    atorvastatin 40 MG tablet Commonly known as: LIPITOR Take 40 mg by mouth daily.   cholecalciferol 1000 units tablet Commonly known as: VITAMIN D Take 1,000 Units by mouth daily.   cyanocobalamin 1000 MCG tablet Commonly known as: VITAMIN B12 Take 1,000 mcg by mouth daily.   estradiol  0.1 MG/GM vaginal cream Commonly known as: ESTRACE  PLACE 0.25 APPLICATORFUL VAGINALLY AT BEDTIME   fluticasone 50 MCG/ACT nasal spray Commonly known as: FLONASE Place 1 spray into both nostrils daily as needed for allergies or rhinitis.   gabapentin 100 MG capsule Commonly known as: NEURONTIN Take 100 mg by mouth 2 (two) times daily.   ipratropium 0.03 % nasal spray Commonly known as: ATROVENT Place 1 spray into the nose 2 (two) times daily as needed for rhinitis.   ipratropium-albuterol  0.5-2.5 (3) MG/3ML Soln Commonly known as: DUONEB Inhale 3 mLs into the lungs 4 (four) times daily as needed (Wheezing).   Jardiance 25 MG Tabs tablet Generic drug: empagliflozin Take 25 mg by mouth daily.   levocetirizine 5 MG tablet Commonly known as: XYZAL Take 5 mg by mouth every evening.   metFORMIN 500 MG tablet Commonly known as: GLUCOPHAGE Take 1,000 mg by mouth 2 (two) times daily with a meal.   omeprazole 40 MG capsule Commonly known as: PRILOSEC Take 40 mg by mouth daily.   Ozempic (2 MG/DOSE) 8 MG/3ML Sopn Generic drug: Semaglutide (2 MG/DOSE) Inject 2 mg into the skin once a week.   pioglitazone 15 MG tablet Commonly known as: ACTOS Take 15 mg by mouth daily.   rizatriptan 10 MG disintegrating tablet Commonly known as: MAXALT-MLT Take 10 mg by mouth as needed for migraine. May repeat in 2 hours if needed   sertraline 50 MG tablet Commonly known as: ZOLOFT Take 75 mg by mouth daily.   Trelegy Ellipta 100-62.5-25 MCG/ACT Aepb Generic drug: Fluticasone-Umeclidin-Vilant Inhale 1 puff into the lungs daily.   valsartan-hydrochlorothiazide 320-12.5 MG tablet Commonly known as:  DIOVAN-HCT Take 1 tablet by mouth daily.   Vitamin D (Ergocalciferol) 1.25 MG (50000 UNIT) Caps capsule Commonly known as: DRISDOL Take 50,000 Units by mouth once a week.        Follow-up Information     Rex Castor, MD. Schedule an appointment as soon as possible for a visit in 1 week(s).   Specialty: Internal Medicine Contact information: [address] [place] [address] [phone]         Timmy Forbes, MD. Schedule an appointment as soon as possible for a visit in 2 week(s).   Specialty: Oncology Why: F/u New onset PE and DVT Contact information: [address] [place] [address] [phone]                Discharge Exam: Cleavon Curls Weights   01/03/24 1601  Weight: 68.5 kg   GENERAL:  63 y.o.-year-old patient with no acute distress. Left eye scleral ingestion LUNGS: Normal breath sounds bilaterally, no wheezing CARDIOVASCULAR: S1, S2 normal. No murmur   ABDOMEN: Soft, nontender, nondistended. EXTREMITIES: No  edema b/l.  NEUROLOGIC: nonfocal  patient is alert and awake  Condition at discharge: fair  The results of significant diagnostics from this hospitalization (including imaging, microbiology, ancillary and laboratory) are listed below for reference.   Imaging Studies: US  Venous Img Lower Bilateral (DVT) Result Date: 01/04/2024 CLINICAL DATA:  Follow up DVT EXAM: BILATERAL LOWER EXTREMITY VENOUS DOPPLER ULTRASOUND TECHNIQUE: Gray-scale sonography with graded compression, as well as color Doppler and duplex ultrasound were performed to evaluate the lower extremity deep venous systems from the level of the common femoral vein and including the common femoral, femoral, profunda femoral, popliteal and calf veins including the posterior tibial, peroneal and gastrocnemius veins when visible. The superficial great saphenous vein was also interrogated. Spectral Doppler was utilized to evaluate flow at rest and with distal augmentation  maneuvers in the common femoral, femoral and popliteal veins. COMPARISON:  Ultrasound 12/30/2023 left FINDINGS: RIGHT LOWER EXTREMITY Common Femoral Vein: No evidence of thrombus. Normal compressibility, respiratory phasicity and response to augmentation. Saphenofemoral Junction: No evidence of thrombus. Normal compressibility and flow on color Doppler imaging. Profunda Femoral Vein: No evidence of thrombus. Normal compressibility and flow on color Doppler imaging. Femoral Vein: No evidence of thrombus. Normal compressibility, respiratory phasicity and response to augmentation. Popliteal Vein: No evidence of thrombus. Normal compressibility, respiratory phasicity and response to augmentation. Calf Veins: No evidence of thrombus. Normal compressibility and flow on color Doppler imaging. Superficial Great Saphenous Vein: No evidence of thrombus. Normal compressibility. Venous Reflux:  None. Other Findings:  None. LEFT LOWER EXTREMITY Common Femoral Vein: No evidence of thrombus. Normal compressibility, respiratory phasicity and response to augmentation. Saphenofemoral Junction: No evidence of thrombus. Normal compressibility and flow on color Doppler imaging. Profunda Femoral Vein: No evidence of thrombus. Normal compressibility and flow on color Doppler imaging. Femoral Vein: No evidence of thrombus. Normal compressibility, respiratory phasicity and response to augmentation. Popliteal Vein: No evidence of thrombus. Normal compressibility, respiratory phasicity and response to augmentation. Calf Veins: There is incomplete compression of the peroneal vein as well as poor flow. This is occlusive. Similar distribution to previous. Superficial Great Saphenous Vein: No evidence of thrombus. Normal compressibility. Venous Reflux:  None. Other Findings:  None. IMPRESSION: Persistent left below-the-knee peroneal vein DVT. No areas of other DVT in either lower extremity. Electronically Signed   By: Adrianna Horde M.D.   On:  01/04/2024 14:58   ECHOCARDIOGRAM COMPLETE Result Date: 01/04/2024    ECHOCARDIOGRAM REPORT   Patient Name:   Latima Hansen Date of Exam: 01/04/2024 Medical Rec #:  [id number]        Height:       67.0 in Accession #:    [id number]       Weight:       151.0 lb Date of Birth:  March 22, 1961        BSA:          1.795 m Patient Age:    62 years         BP:           111/69 mmHg Patient Gender: F                HR:           90 bpm. Exam Location:  [facility] Procedure: 2D Echo, Color Doppler and Cardiac Doppler (Both Spectral and Color            Flow Doppler were utilized during procedure). Indications:     I26.09 Pulmonary Embolus  History:  Patient has prior history of Echocardiogram examinations.                  Stroke and Asthma; Risk Factors:Diabetes, Hypertension and                  Dyslipidemia.  Sonographer:     L. Thornton-Maynard Referring Phys:  [id number] Diodato Diagnosing Phys: Antonette Batters MD IMPRESSIONS  1. Left ventricular ejection fraction, by estimation, is 60 to 65%. The left ventricle has normal function. The left ventricle has no regional wall motion abnormalities. Left ventricular diastolic parameters are consistent with Grade I diastolic dysfunction (impaired relaxation).  2. Right ventricular systolic function is normal. The right ventricular size is normal. There is normal pulmonary artery systolic pressure.  3. The mitral valve is normal in structure. Trivial mitral valve regurgitation.  4. The aortic valve is normal in structure. Aortic valve regurgitation is not visualized. FINDINGS  Left Ventricle: Left ventricular ejection fraction, by estimation, is 60 to 65%. The left ventricle has normal function. The left ventricle has no regional wall motion abnormalities. Strain was performed and the global longitudinal strain is indeterminate. Global longitudinal strain performed but not reported based on interpreter judgement due to suboptimal tracking. The left ventricular internal cavity  size was normal in size. There is no left ventricular hypertrophy. Left ventricular diastolic parameters are consistent with Grade I diastolic dysfunction (impaired relaxation). Right Ventricle: The right ventricular size is normal. No increase in right ventricular wall thickness. Right ventricular systolic function is normal. There is normal pulmonary artery systolic pressure. The tricuspid regurgitant velocity is 2.14 m/s, and  with an assumed right atrial pressure of 3 mmHg, the estimated right ventricular systolic pressure is 21.3 mmHg. Left Atrium: Left atrial size was normal in size. Right Atrium: Right atrial size was normal in size. Pericardium: There is no evidence of pericardial effusion. Mitral Valve: The mitral valve is normal in structure. Trivial mitral valve regurgitation. Tricuspid Valve: The tricuspid valve is normal in structure. Tricuspid valve regurgitation is mild. Aortic Valve: The aortic valve is normal in structure. Aortic valve regurgitation is not visualized. Aortic valve mean gradient measures 3.5 mmHg. Aortic valve peak gradient measures 5.8 mmHg. Aortic valve area, by VTI measures 1.70 cm. Pulmonic Valve: The pulmonic valve was normal in structure. Pulmonic valve regurgitation is not visualized. Aorta: The ascending aorta was not well visualized. IAS/Shunts: No atrial level shunt detected by color flow Doppler. Additional Comments: 3D was performed not requiring image post processing on an independent workstation and was indeterminate.  LEFT VENTRICLE PLAX 2D LVIDd:         3.80 cm     Diastology LVIDs:         2.60 cm     LV e' medial:    6.74 cm/s LV PW:         1.00 cm     LV E/e' medial:  10.3 LV IVS:        1.00 cm     LV e' lateral:   8.05 cm/s LVOT diam:     1.60 cm     LV E/e' lateral: 8.6 LV SV:         38 LV SV Index:   21 LVOT Area:     2.01 cm  LV Volumes (MOD) LV vol d, MOD A2C: 26.4 ml LV vol d, MOD A4C: 32.3 ml LV vol s, MOD A2C: 5.7 ml LV vol s, MOD A4C: 10.1 ml LV SV  MOD A2C:     20.7 ml LV SV MOD A4C:     32.3 ml LV SV MOD BP:      21.3 ml RIGHT VENTRICLE             IVC RV Basal diam:  2.70 cm     IVC diam: 0.90 cm RV Mid diam:    1.90 cm RV S prime:     12.50 cm/s TAPSE (M-mode): 1.8 cm LEFT ATRIUM             Index        RIGHT ATRIUM          Index LA diam:        2.50 cm 1.39 cm/m   RA Area:     8.16 cm LA Vol (A2C):   25.3 ml 14.10 ml/m  RA Volume:   13.80 ml 7.69 ml/m LA Vol (A4C):   21.6 ml 12.04 ml/m LA Biplane Vol: 23.8 ml 13.26 ml/m  AORTIC VALVE                    PULMONIC VALVE AV Area (Vmax):    1.38 cm     PV Vmax:       0.73 m/s AV Area (Vmean):   1.58 cm     PV Peak grad:  2.1 mmHg AV Area (VTI):     1.70 cm AV Vmax:           120.00 cm/s AV Vmean:          85.700 cm/s AV VTI:            0.222 m AV Peak Grad:      5.8 mmHg AV Mean Grad:      3.5 mmHg LVOT Vmax:         82.10 cm/s LVOT Vmean:        67.500 cm/s LVOT VTI:          0.187 m LVOT/AV VTI ratio: 0.84  AORTA Ao Root diam: 3.00 cm Ao Asc diam:  3.20 cm MITRAL VALVE                TRICUSPID VALVE MV Area (PHT): 5.27 cm     TR Peak grad:   18.3 mmHg MV Decel Time: 144 msec     TR Vmax:        214.00 cm/s MV E velocity: 69.20 cm/s MV A velocity: 103.00 cm/s  SHUNTS MV E/A ratio:  0.67         Systemic VTI:  0.19 m                             Systemic Diam: 1.60 cm Antonette Batters MD Electronically signed by Antonette Batters MD Signature Date/Time: 01/04/2024/11:42:42 AM    Final    MR BRAIN WO CONTRAST Result Date: 01/03/2024 CLINICAL DATA:  Neuro deficit, acute, stroke suspected EXAM: MRI HEAD WITHOUT CONTRAST TECHNIQUE: Multiplanar, multiecho pulse sequences of the brain and surrounding structures were obtained without intravenous contrast. COMPARISON:  None Available. FINDINGS: Brain: No acute infarction, hemorrhage, hydrocephalus, extra-axial collection or mass lesion. Vascular: Normal flow voids. Skull and upper cervical spine: Normal marrow signal. Sinuses/Orbits: Negative. IMPRESSION:  No evidence of acute intracranial abnormality. Electronically Signed   By: Stevenson Elbe M.D.   On: 01/03/2024 19:16   CT Angio Chest PE W and/or Wo Contrast Addendum Date: 01/03/2024 ADDENDUM REPORT: 01/03/2024  17:15 ADDENDUM: These results were called by telephone at the time of interpretation on 01/03/2024 at 5:15 pm to provider Bryson Carbine , who verbally acknowledged these results. Electronically Signed   By: Morgane  Naveau M.D.   On: 01/03/2024 17:15   Result Date: 01/03/2024 CLINICAL DATA:  Pulmonary embolism (PE) suspected, high prob. CP and dizziness since 2 days ago, has known DVT to L leg, on Eliquis. Also feels SOB. Pt slow to answer questions. Pt has unlabored breathing, skin dr Laurita Porta: CT ANGIOGRAPHY CHEST WITH CONTRAST TECHNIQUE: Multidetector CT imaging of the chest was performed using the standard protocol during bolus administration of intravenous contrast. Multiplanar CT image reconstructions and MIPs were obtained to evaluate the vascular anatomy. RADIATION DOSE REDUCTION: This exam was performed according to the departmental dose-optimization program which includes automated exposure control, adjustment of the mA and/or kV according to patient size and/or use of iterative reconstruction technique. CONTRAST:  75mL OMNIPAQUE  IOHEXOL  350 MG/ML SOLN COMPARISON:  None Available. FINDINGS: Cardiovascular: Satisfactory opacification of the pulmonary arteries to the segmental level. Right lower lobe segmental subsegmental and right middle lobe segmental pulmonary artery filling defects. The main pulmonary artery is normal in caliber. Normal heart size. No significant pericardial effusion. The thoracic aorta is normal in caliber. Trace atherosclerotic plaque of the thoracic aorta. No definite coronary artery calcifications. Trace mitral annular calcification. Mediastinum/Nodes: No enlarged mediastinal, hilar, or axillary lymph nodes. Thyroid gland, trachea, and esophagus demonstrate no  significant findings. Lungs/Pleura: No focal consolidation. No pulmonary nodule. No pulmonary mass. No pleural effusion. No pneumothorax. Upper Abdomen: Status post cholecystectomy. Musculoskeletal: No chest wall abnormality. No suspicious lytic or blastic osseous lesions. No acute displaced fracture. Review of the MIP images confirms the above findings. IMPRESSION: Right lower lobe segmental/subsegmental and right middle lobe segmental pulmonary emboli. No associated right heart strain or pulmonary infarction. Electronically Signed: By: Morgane  Naveau M.D. On: 01/03/2024 17:11   CT HEAD WO CONTRAST Result Date: 01/03/2024 CLINICAL DATA:  Neuro deficit, acute, stroke suspected Pt to ED with son and daughter for CP and dizziness since 2 days ago, has known DVT to L leg, on Eliquis. Also feels SOB. Pt slow to answer questions. Pt has unlabored breathing, skin EXAM: CT HEAD WITHOUT CONTRAST TECHNIQUE: Contiguous axial images were obtained from the base of the skull through the vertex without intravenous contrast. RADIATION DOSE REDUCTION: This exam was performed according to the departmental dose-optimization program which includes automated exposure control, adjustment of the mA and/or kV according to patient size and/or use of iterative reconstruction technique. COMPARISON:  CT head 04/07/2016 FINDINGS: Brain: No evidence of large-territorial acute infarction. No parenchymal hemorrhage. No mass lesion. No extra-axial collection. No mass effect or midline shift. No hydrocephalus. Basilar cisterns are patent. Vascular: No hyperdense vessel. Skull: No acute fracture or focal lesion. Sinuses/Orbits: Paranasal sinuses and mastoid air cells are clear. The orbits are unremarkable. Other: None. IMPRESSION: No acute intracranial abnormality. Electronically Signed   By: Morgane  Naveau M.D.   On: 01/03/2024 17:04   DG Chest 2 View Result Date: 01/03/2024 CLINICAL DATA:  Chest pain EXAM: CHEST - 2 VIEW COMPARISON:  X-ray  11/08/2022 and older. CT chest contrast 12/22/2023. FINDINGS: Underinflation. No consolidation, pneumothorax or effusion. No edema. Normal cardiopericardial silhouette. Overlapping cardiac leads. IMPRESSION: Underinflation.  No acute cardiopulmonary disease. Electronically Signed   By: Adrianna Horde M.D.   On: 01/03/2024 16:27   US  Venous Img Lower Unilateral Left (DVT) Result Date: 12/30/2023 CLINICAL DATA:  Lower extremity pain  EXAM: LEFT LOWER EXTREMITY VENOUS DOPPLER ULTRASOUND TECHNIQUE: Gray-scale sonography with compression, as well as color and duplex ultrasound, were performed to evaluate the deep venous system(s) from the level of the common femoral vein through the popliteal and proximal calf veins. COMPARISON:  None Available. FINDINGS: VENOUS Normal compressibility of the common femoral, superficial femoral, and popliteal veins. Visualized portions of profunda femoral vein and great saphenous vein unremarkable. A left peroneal vein below the knee is identified which is noncompressible. OTHER None. Limitations: none IMPRESSION: 1. DVT in a left peroneal vein below the knee. These results will be called to the ordering clinician or representative by the Radiologist Assistant, and communication documented in the PACS or [employer]. Electronically Signed   By: Reagan Camera M.D.   On: 12/30/2023 16:38   CT CHEST W CONTRAST Result Date: 12/22/2023 CLINICAL DATA:  Chest pain and shortness of breath. EXAM: CT CHEST WITH CONTRAST TECHNIQUE: Multidetector CT imaging of the chest was performed during intravenous contrast administration. RADIATION DOSE REDUCTION: This exam was performed according to the departmental dose-optimization program which includes automated exposure control, adjustment of the mA and/or kV according to patient size and/or use of iterative reconstruction technique. CONTRAST:  75mL OMNIPAQUE  IOHEXOL  300 MG/ML  SOLN COMPARISON:  Chest x-ray 11/08/2022 FINDINGS: Cardiovascular: No  significant vascular findings. Normal heart size. No pericardial effusion. Mediastinum/Nodes: No enlarged mediastinal, hilar, or axillary lymph nodes. Thyroid gland, trachea, and esophagus demonstrate no significant findings. Small hiatal hernia. Lungs/Pleura: The lungs are clear of an acute process. No infiltrates, edema or effusions. No pulmonary lesions or pulmonary nodules. No pleural nodules. The tracheobronchial tree is unremarkable. No interstitial lung disease or bronchiectasis. Upper Abdomen: Status post cholecystectomy. No biliary dilatation. 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 Musculoskeletal: No chest wall abnormality. No acute or significant osseous findings. IMPRESSION: 1. No acute pulmonary findings, pulmonary lesions or nodules. 2. No mediastinal or hilar mass or adenopathy. 3. Small hiatal hernia. 4. 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 Electronically Signed   By: Marrian Siva M.D.   On: 12/22/2023 14:14    Microbiology: Results for orders placed or performed in visit on 11/27/22  Urine Culture     Status: Abnormal   Collection Time: 11/27/22 11:36 AM   Specimen: Urine   UR  Result Value Ref Range Status   Urine Culture, Routine Final report (A)  Final   Organism ID, Bacteria Comment (A)  Final    Comment: Beta hemolytic Streptococcus, group B 10,000-25,000 colony forming units per mL Penicillin and ampicillin  are drugs of choice for treatment of beta-hemolytic streptococcal infections. Susceptibility testing of penicillins and other beta-lactam agents approved by the FDA for treatment of beta-hemolytic streptococcal infections need not be performed routinely because nonsusceptible isolates are extremely rare in any beta-hemolytic streptococcus and have not been reported for Streptococcus pyogenes (group A). (CLSI)    ORGANISM ID, BACTERIA Comment  Final     Comment: Mixed urogenital flora Less than 10,000 colonies/mL     Labs: CBC: Recent Labs  Lab 01/03/24 1614  WBC 5.7  HGB 15.2*  HCT 46.5*  MCV 91.2  PLT 163   Basic Metabolic Panel: Recent Labs  Lab 01/03/24 1614  NA 133*  K 4.3  CL 102  CO2 20*  GLUCOSE 143*  BUN 17  CREATININE 0.78  CALCIUM 8.8*   CBG: Recent Labs  Lab 01/04/24 1906 01/04/24 2330 01/05/24 0329 01/05/24 0339 01/05/24 0735  GLUCAP 154* 202* 216* 120* 132*    Discharge time spent: greater than 30 minutes.  Signed: Melvinia Stager, MD [facility] 01/05/2024

## 2024-01-05 NOTE — Plan of Care (Signed)

## 2024-01-05 NOTE — Consult Note (Signed)
 PHARMACY - ANTICOAGULATION CONSULT NOTE  Pharmacy Consult for heparin infusion Indication: DVT/PE  Allergies  Allergen Reactions   Codeine Itching, Anaphylaxis, Dermatitis, Other (See Comments), Palpitations and Shortness Of Breath    Other Reaction(s): Not available   Sulfamethoxazole-Trimethoprim Dermatitis   Patient Measurements: Height: 5\' 7"  (170.2 cm) Weight: 68.5 kg (151 lb) IBW/kg (Calculated) : 61.6 HEPARIN DW (KG): 68.5  Vital Signs: Temp: 97.5 F (36.4 C) (05/27 0316) Temp Source: Oral (05/26 2315) BP: 112/68 (05/27 0316) Pulse Rate: 95 (05/27 0316)  Labs: Recent Labs    01/03/24 1614 01/03/24 1748 01/03/24 1748 01/03/24 2023 01/04/24 0130 01/04/24 0809 01/05/24 0340  HGB 15.2*  --   --   --   --   --   --   HCT 46.5*  --   --   --   --   --   --   PLT 163  --   --   --   --   --   --   APTT  --  22*   < >  --  72* 72* 107*  LABPROT  --  13.4  --   --   --   --   --   INR  --  1.0  --   --   --   --   --   HEPARINUNFRC  --  >1.10*  --   --   --  >1.10* >1.10*  CREATININE 0.78  --   --   --   --   --   --   TROPONINIHS 3  --   --  3  --   --   --    < > = values in this interval not displayed.   Estimated Creatinine Clearance: 71.8 mL/min (by C-G formula based on SCr of 0.78 mg/dL).  Medical History: Past Medical History:  Diagnosis Date   Asthma    Diabetes mellitus without complication (HCC)    DVT (deep venous thrombosis) (HCC) 12/31/2023   on eliquis   Hiatal hernia    Hypercholesteremia    Hypertension    Long COVID     Medications:  Apixaban started pack (start date 5/21; last dose today 5/25 at 8 am)  Assessment: 63 yo female admitted with increasing shortness of breath and chest tightness. Recently diagnosed with DVT on May/21 and started on Eliquis started pack. PMH includes long-haul COVID, hypertension, history of stroke with mild left-sided weakness, type II diabetes, and hyperlipidemia.  Hgb and plt appropriate to start heparin  infusion. Baseline aPTT, INR and HL ordered prior to initiating heparin treatment. HL > 1.10, will monitor infusion with aPTT for now.  Goal of Therapy:  Heparin level 0.3-0.7 units/ml aPTT 66-102 seconds Monitor platelets by anticoagulation protocol: Yes   Plan:  5/27 0340 aPTT @ 107, supratherapeutic / HL >1.1 Will decrease heparin infusion rate to 1100 units/hr Will check aPTT until HL correlating Recheck aPTT in 6 hrs after rate change Continue to monitor H&H and platelets  Coretta Dexter, PharmD, [facility] 01/05/2024 4:35 AM

## 2024-01-05 NOTE — Evaluation (Signed)
 Occupational Therapy Evaluation Patient Details Name: Kristine Gilbert MRN: [id number] DOB: 12/04/1960 Today's Date: 01/05/2024   History of Present Illness   Pt is a 63 y.o. female presenting to hospital 01/03/24 with c/o chest pain, DVT, dizziness, and SOB.  Recently diagnosed with L LE DVT.  Pt admitted with acute PE R subsegmental/R middle lobe, h/o L peroneal DVT diagnosed 12/30/23, L sided weakness (with h/o stroke in remote past).  PMH includes asthma, DM, htn, long-haul COVID, h/o stroke with mild L sided weakness.     Clinical Impressions Patient presenting with decreased Ind in self care,balance, functional mobility/transfers, endurance, and safety awareness. 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 Patient currently functioning at min A overall for toileting, transfers with RW, and dressing with sit <>stand from EOB. Pt needing rest breaks secondary to fatigue and reports family will be assisting as needed at discharge. Pt remains seated on EOB at end of session with call bell and all needs within reach. Patient will benefit from acute OT to increase overall independence in the areas of ADLs, functional mobility, and safety awareness in order to safely discharge.     If plan is discharge home, recommend the following:   A little help with walking and/or transfers;A little help with bathing/dressing/bathroom;Assistance with cooking/housework;Assist for transportation;Help with stairs or ramp for entrance     Functional Status Assessment   Patient has had a recent decline in their functional status and demonstrates the ability to make significant improvements in function in a reasonable and predictable amount of time.     Equipment Recommendations   BSC/3in1      Precautions/Restrictions   Precautions Precautions: Fall     Mobility Bed Mobility Overal bed  mobility: Modified Independent             General bed mobility comments: increased time and effort    Transfers Overall transfer level: Needs assistance Equipment used: Rolling walker (2 wheels) Transfers: Sit to/from Stand Sit to Stand: Contact guard assist, Min assist                  Balance Overall balance assessment: Needs assistance Sitting-balance support: No upper extremity supported, Feet supported Sitting balance-Leahy Scale: Good Sitting balance - Comments: steady reaching within BOS   Standing balance support: Bilateral upper extremity supported, During functional activity, Reliant on assistive device for balance Standing balance-Leahy Scale: Good Standing balance comment: steady ambulating with RW use                           ADL either performed or assessed with clinical judgement   ADL Overall ADL's : Needs assistance/impaired     Grooming: Wash/dry hands;Standing;Supervision/safety           Upper Body Dressing : Set up;Sitting;Supervision/safety   Lower Body Dressing: Sit to/from stand;Minimal assistance   Toilet Transfer: Minimal assistance;Rolling walker (2 wheels);Regular Toilet   Toileting- Clothing Manipulation and Hygiene: Contact guard assist;Sit to/from stand       Functional mobility during ADLs: Contact guard assist;Rolling walker (2 wheels)       Vision Patient Visual Report: No change from baseline              Pertinent Vitals/Pain Pain Assessment Pain Assessment: Faces Faces Pain Scale: Hurts a little bit Pain Location: L LE Pain Descriptors / Indicators:  Discomfort Pain Intervention(s): Limited activity within patient's tolerance, Monitored during session, Repositioned     Extremity/Trunk Assessment Upper Extremity Assessment Upper Extremity Assessment: Generalized weakness   Lower Extremity Assessment Lower Extremity Assessment: Generalized weakness RLE Deficits / Details: hip flexion, knee  flexion/extension, and DF 5/5 LLE Deficits / Details: hip flexion 4/5, knee flexion/extension 4/5, and DF 4-/5       Communication Communication Communication: No apparent difficulties   Cognition Arousal: Alert Behavior During Therapy: WFL for tasks assessed/performed Cognition: No apparent impairments                               Following commands: Intact       Cueing  General Comments   Cueing Techniques: Verbal cues              Home Living Family/patient expects to be discharged to:: Private residence Living Arrangements: Parent;Other relatives (mother and sister) Available Help at Discharge: Family;Available 24 hours/day Type of Home: House Home Access: Stairs to enter [employer] of Steps: 4-6 Entrance Stairs-Rails: Right;Left Home Layout: One level     Bathroom Shower/Tub: Producer, television/film/video: Standard     Home Equipment: Grab bars - toilet;Shower seat - built Charity fundraiser (2 wheels);Cane - single point          Prior Functioning/Environment Prior Level of Function : Needs assist             Mobility Comments: Pt has been using RW for the past month d/t weakness (prior to that pt used a cane if she felt weak). ADLs Comments: Pt reports being Ind until recently needing min A from sister for self care and IADLs. She is her mother's caregiver but sister has had to move in to assist both of them.    OT Problem List: Decreased strength;Decreased activity tolerance;Impaired balance (sitting and/or standing);Decreased knowledge of precautions;Impaired vision/perception;Decreased safety awareness;Decreased knowledge of use of DME or AE;Cardiopulmonary status limiting activity   OT Treatment/Interventions: Self-care/ADL training;Therapeutic activities;Therapeutic exercise;Energy conservation;Patient/family education;Balance training;DME and/or AE instruction      OT Goals(Current goals can be found in the  care plan section)   Acute Rehab OT Goals Patient Stated Goal: to feel better and go home OT Goal Formulation: With patient Time For Goal Achievement: 01/19/24 Potential to Achieve Goals: Fair ADL Goals Pt Will Perform Grooming: with modified independence;standing Pt Will Perform Lower Body Dressing: with modified independence;sit to/from stand Pt Will Transfer to Toilet: with modified independence;ambulating Pt Will Perform Toileting - Clothing Manipulation and hygiene: with modified independence;sit to/from stand   OT Frequency:  Min 1X/week       AM-PAC OT "6 Clicks" Daily Activity     Outcome Measure Help from another person eating meals?: None Help from another person taking care of personal grooming?: None Help from another person toileting, which includes using toliet, bedpan, or urinal?: A Little Help from another person bathing (including washing, rinsing, drying)?: A Little Help from another person to put on and taking off regular upper body clothing?: None Help from another person to put on and taking off regular lower body clothing?: A Little 6 Click Score: 21   End of Session Equipment Utilized During Treatment: Rolling walker (2 wheels) Nurse Communication: Mobility status;Other (comment) (Pt is dressed for discharge)  Activity Tolerance: Patient tolerated treatment well Patient left: in bed;with call bell/phone within reach;with bed alarm set  OT Visit Diagnosis: Unsteadiness on  feet (R26.81);Muscle weakness (generalized) (M62.81)                Time: 1308-6578 OT Time Calculation (min): 18 min Charges:  OT General Charges $OT Visit: 1 Visit OT Evaluation $OT Eval Low Complexity: 1 Low OT Treatments $Self Care/Home Management : 8-22 mins  George Kinder, MS, OTR/L , CBIS ascom [phone]  01/05/24, 11:44 AM

## 2024-01-05 NOTE — TOC Transition Note (Cosign Needed)
 Transition of Care [facility]) - Discharge Note   Patient Details  Name: Kristine Gilbert MRN: [id number] Date of Birth: 1961/02/22  [facility]) CM/SW Contact:  Cecilio Ohlrich C Christipher Rieger, RN Phone Number: 01/05/2024, 3:06 PM   Clinical Narrative:    Spoke with patient regarding discharege. She is agreeable to receive HH and was advised it has been arranged with [facility] for PT. Patient is agreeable to receive a RW and WC and was advised it will be delivered to her home.   Spoke with Loetta Ringer from [facility] to notify of discharge.  TOC signing off.           Patient Goals and CMS Choice            Discharge Placement                       Discharge Plan and Services Additional resources added to the After Visit Summary for                                       Social Drivers of Health (SDOH) Interventions SDOH Screenings   Food Insecurity: Food Insecurity Present (01/04/2024)  Housing: High Risk (01/04/2024)  Transportation Needs: Unmet Transportation Needs (01/04/2024)  Utilities: At Risk (01/04/2024)  Financial Resource Strain: Low Risk  (12/29/2023)   Received from [facility]  Physical Activity: Inactive (03/09/2023)   Received from [facility]  Social Connections: Moderately Isolated (03/09/2023)   Received from [facility]  Stress: Stress Concern Present (03/09/2023)   Received from [facility]  Tobacco Use: Low Risk  (01/03/2024)  Health Literacy: Inadequate Health Literacy (03/09/2023)   Received from [facility]     Readmission Risk Interventions     No data to display

## 2024-01-05 NOTE — Evaluation (Signed)
 Physical Therapy Evaluation Patient Details Name: Kristine Gilbert MRN: [id number] DOB: 14-Mar-1961 Today's Date: 01/05/2024  History of Present Illness  Pt is a 63 y.o. female presenting to hospital 01/03/24 with c/o chest pain, DVT, dizziness, and SOB.  Recently diagnosed with L LE DVT.  Pt admitted with acute PE R subsegmental/R middle lobe, h/o L peroneal DVT diagnosed 12/30/23, L sided weakness (with h/o stroke in remote past).  PMH includes asthma, DM, htn, long-haul COVID, h/o stroke with mild L sided weakness.  Clinical Impression  Prior to recent medical concerns, pt reports being ambulatory with RW for past month (prior to that used [facility] as needed); lives with her mom (sister staying with pt last 3-4 months to assist d/t pt being sick); 1 level home with 4-6 STE B railings.  Currently pt is modified independent with bed mobility; CGA with transfer using RW; and CGA with ambulation 50 feet with RW use.  Limited ambulation d/t pt reporting chest tightness (4-5/10) and then increasing dizziness (was 10/10 dizziness by the time pt walked back to bed to sit down and felt like she was going to pass out but didn't).  Pt's HR 94-105 bpm and SpO2 sats 96% or greater on room air during sessions activities.  After laying down in bed, pt reporting improved symptoms (2/10 chest tightness and 2/10 dizziness).  Pt's nurse and MD notified immediately regarding pt's symptoms and vitals noted above.   Pt would currently benefit from skilled PT to address noted impairments and functional limitations (see below for any additional details).  Upon hospital discharge, pt would benefit from ongoing therapy.     If plan is discharge home, recommend the following: A little help with walking and/or transfers;A little help with bathing/dressing/bathroom;Assistance with cooking/housework;Assist for transportation;Help with stairs or ramp for entrance   Can travel by private vehicle        Equipment Recommendations Rolling  walker (2 wheels);Wheelchair (measurements PT);Wheelchair cushion (measurements PT) (pt has RW at home already; pt declined manual w/c when discussed during session (d/t pt preferring to walk as much as possible))  Recommendations for Other Services       Functional Status Assessment Patient has had a recent decline in their functional status and demonstrates the ability to make significant improvements in function in a reasonable and predictable amount of time.     Precautions / Restrictions Precautions Precautions: Fall Restrictions Weight Bearing Restrictions Per Provider Order: No      Mobility  Bed Mobility Overal bed mobility: Modified Independent             General bed mobility comments: Semi-supine to/from sitting on EOB    Transfers Overall transfer level: Needs assistance Equipment used: Rolling walker (2 wheels) Transfers: Sit to/from Stand Sit to Stand: Contact guard assist           General transfer comment: increased effort to stand from bed; vc's for UE placement    Ambulation/Gait Ambulation/Gait assistance: Contact guard assist Gait Distance (Feet): 50 Feet Assistive device: Rolling walker (2 wheels)   Gait velocity: decreased     General Gait Details: initially narrow BOS but improved BOS noted with cueing; partial to step through gait pattern; increased time to take steps but overall steady  Stairs            Wheelchair Mobility     Tilt Bed    Modified Rankin (Stroke Patients Only)       Balance Overall balance assessment: Needs assistance Sitting-balance support: No  upper extremity supported, Feet supported Sitting balance-Leahy Scale: Good Sitting balance - Comments: steady reaching within BOS   Standing balance support: Bilateral upper extremity supported, During functional activity, Reliant on assistive device for balance Standing balance-Leahy Scale: Good Standing balance comment: steady ambulating with RW use                              Pertinent Vitals/Pain Pain Assessment Pain Assessment: 0-10 Pain Score: 7  Pain Location: L leg and foot (recent symptoms d/t DVT per pt report), L part of neck (symptoms present for months), and R inner thigh (symptoms present for about 1 day) Pain Descriptors / Indicators: Discomfort Pain Intervention(s): Limited activity within patient's tolerance, Monitored during session, Repositioned, Other (comment) (RN notified)    Home Living Family/patient expects to be discharged to:: Private residence Living Arrangements: Parent;Other relatives (Pt's mom; pt's sister has been staying with pt for last 3-4 months d/t pt being sick and needing assist) Available Help at Discharge: Family (Pt unsure how much longer her sister is planning on staying with her to assist) Type of Home: House Home Access: Stairs to enter Entrance Stairs-Rails: Doctor, general practice of Steps: 4-6   Home Layout: One level Home Equipment: Grab bars - toilet;Shower seat - built [employer] fundraiser (2 wheels);Cane - single point      Prior Function Prior Level of Function : Needs assist             Mobility Comments: Pt has been using RW for the past month d/t weakness (prior to that pt used a cane if she felt weak).       Extremity/Trunk Assessment   Upper Extremity Assessment Upper Extremity Assessment: Defer to OT evaluation    Lower Extremity Assessment Lower Extremity Assessment: RLE deficits/detail;LLE deficits/detail RLE Deficits / Details: hip flexion, knee flexion/extension, and DF 5/5 LLE Deficits / Details: hip flexion 4/5, knee flexion/extension 4/5, and DF 4-/5       Communication   Communication Communication: No apparent difficulties    Cognition Arousal: Alert Behavior During Therapy: WFL for tasks assessed/performed   PT - Cognitive impairments: No apparent impairments                         Following commands:  Impaired Following commands impaired: Follows multi-step commands with increased time     Cueing Cueing Techniques: Verbal cues     General Comments  MD Lydia Sams and pt's nurse cleared pt for participation in physical therapy.  Pt agreeable to PT session.    Exercises     Assessment/Plan    PT Assessment Patient needs continued PT services  PT Problem List Decreased strength;Decreased activity tolerance;Decreased balance;Decreased mobility;Cardiopulmonary status limiting activity;Pain       PT Treatment Interventions DME instruction;Gait training;Stair training;Functional mobility training;Therapeutic activities;Therapeutic exercise;Balance training;Patient/family education    PT Goals (Current goals can be found in the Care Plan section)  Acute Rehab PT Goals Patient Stated Goal: to improve walking PT Goal Formulation: With patient Time For Goal Achievement: 01/19/24 Potential to Achieve Goals: Good    Frequency Min 2X/week     Co-evaluation               AM-PAC PT "6 Clicks" Mobility  Outcome Measure Help needed turning from your back to your side while in a flat bed without using bedrails?: None Help needed moving from lying on your back to sitting  on the side of a flat bed without using bedrails?: None Help needed moving to and from a bed to a chair (including a wheelchair)?: A Little Help needed standing up from a chair using your arms (e.g., wheelchair or bedside chair)?: A Little Help needed to walk in hospital room?: A Little Help needed climbing 3-5 steps with a railing? : A Little 6 Click Score: 20    End of Session Equipment Utilized During Treatment: Gait belt Activity Tolerance: Other (comment) (Limited d/t pt's c/o dizziness and chest pain) Patient left: in bed;with call bell/phone within reach;with bed alarm set Nurse Communication: Mobility status;Precautions;Other (comment) (Pt's vitals and symptoms during session) PT Visit Diagnosis: Other  abnormalities of gait and mobility (R26.89);Muscle weakness (generalized) (M62.81);Pain Pain - Right/Left: Left Pain - part of body: Leg;Ankle and joints of foot    Time: 4098-1191 PT Time Calculation (min) (ACUTE ONLY): 28 min   Charges:   PT Evaluation $PT Eval Low Complexity: 1 Low PT Treatments $Therapeutic Activity: 8-22 mins PT General Charges $$ ACUTE PT VISIT: 1 Visit        Amador Junes, PT 01/05/24, 9:54 AM

## 2024-01-08 ENCOUNTER — Other Ambulatory Visit: Payer: Self-pay | Admitting: Physician Assistant

## 2024-01-08 ENCOUNTER — Ambulatory Visit
Admission: RE | Admit: 2024-01-08 | Discharge: 2024-01-08 | Disposition: A | Discharge status: Home / Self Care | Source: Ambulatory Visit | Attending: Physician Assistant | Admitting: Physician Assistant

## 2024-01-08 DIAGNOSIS — R519 Headache, unspecified: Secondary | ICD-10-CM

## 2024-01-10 ENCOUNTER — Ambulatory Visit

## 2024-01-14 ENCOUNTER — Inpatient Hospital Stay

## 2024-01-14 ENCOUNTER — Inpatient Hospital Stay: Attending: Oncology | Admitting: Oncology

## 2024-01-14 ENCOUNTER — Encounter: Payer: Self-pay | Admitting: Oncology

## 2024-01-14 VITALS — BP 122/80 | HR 105 | Temp 97.1°F | Resp 18 | Wt 155.5 lb

## 2024-01-14 DIAGNOSIS — I2699 Other pulmonary embolism without acute cor pulmonale: Secondary | ICD-10-CM | POA: Insufficient documentation

## 2024-01-14 DIAGNOSIS — Z7901 Long term (current) use of anticoagulants: Secondary | ICD-10-CM | POA: Diagnosis not present

## 2024-01-14 DIAGNOSIS — I82452 Acute embolism and thrombosis of left peroneal vein: Secondary | ICD-10-CM | POA: Diagnosis not present

## 2024-01-14 DIAGNOSIS — R531 Weakness: Secondary | ICD-10-CM | POA: Insufficient documentation

## 2024-01-14 NOTE — Assessment & Plan Note (Signed)
 MRI brain during hospitalization showed no acute stroke. Likely combination of residual weakness from previous stroke plus deconditioning. Patient to follow-up with primary care provider

## 2024-01-14 NOTE — Assessment & Plan Note (Signed)
 Unprovoked acute pulmonary embolism as well as left lower extremity DVT. I agree with continuing Eliquis  5 mg twice daily for therapeutic anticoagulation.  Clinically she has experienced improvement of her breathing status. Based on the history she provided, she has recurrent thrombosis. Check hypercoagulable profile with antiphospholipid antibodies, beta-2 glycoprotein antibodies, factor V gene mutation, prothrombin gene mutation, Antithrombin III Will hold off checking protein C and S during  acute/subacute setting. Plan to repeat left lower extremity ultrasound in 3 months.

## 2024-01-14 NOTE — Progress Notes (Signed)
 Hematology/Oncology Consult note [phone] [phone]        REFERRING PROVIDER: Melvinia Stager, MD   CHIEF COMPLAINTS/REASON FOR VISIT:  Evaluation of acute pulm embolism and left lower extremity DVT   ASSESSMENT & PLAN:   Acute pulmonary embolism (HCC) Unprovoked acute pulmonary embolism as well as left lower extremity DVT. I agree with continuing Eliquis  5 mg twice daily for therapeutic anticoagulation.  Clinically she has experienced improvement of her breathing status. Based on the history she provided, she has recurrent thrombosis. Check hypercoagulable profile with antiphospholipid antibodies, beta-2 glycoprotein antibodies, factor V gene mutation, prothrombin gene mutation, Antithrombin III Will hold off checking protein C and S during  acute/subacute setting. Plan to repeat left lower extremity ultrasound in 3 months.   Acute deep vein thrombosis (DVT) of left peroneal vein (HCC) See above  Left-sided weakness MRI brain during hospitalization showed no acute stroke. Likely combination of residual weakness from previous stroke plus deconditioning. Patient to follow-up with primary care provider   Orders Placed This Encounter  Procedures   US  Venous Img Lower Unilateral Left (DVT)    Standing Status:   Future    Expected Date:   04/15/2024    Expiration Date:   01/13/2025    Reason for Exam (SYMPTOM  OR DIAGNOSIS REQUIRED):   DVT follow up    Preferred imaging location?:   [facility]   CMP (Cancer Center only)    Standing Status:   Future    Expected Date:   04/15/2024    Expiration Date:   01/13/2025   CBC with Differential (Cancer Center Only)    Standing Status:   Future    Expected Date:   04/15/2024    Expiration Date:   01/13/2025   D-dimer, quantitative    Standing Status:   Future    Expected Date:   04/15/2024    Expiration Date:   01/13/2025   CBC with Differential/Platelet    Standing Status:   Future    Expected Date:   02/01/2024     Expiration Date:   01/13/2025   ANTIPHOSPHOLIPID SYNDROME PROF    Standing Status:   Future    Expected Date:   02/01/2024    Expiration Date:   01/13/2025   Factor 5 leiden    Standing Status:   Future    Expected Date:   02/01/2024    Expiration Date:   01/13/2025   PNH Profile (-High Sensitivity)    Standing Status:   Future    Expected Date:   02/01/2024    Expiration Date:   01/13/2025   Prothrombin gene mutation    Standing Status:   Future    Expected Date:   02/01/2024    Expiration Date:   01/13/2025   Antithrombin III    Standing Status:   Future    Expected Date:   02/01/2024    Expiration Date:   01/13/2025   Beta-2-glycoprotein i abs, IgG/M/A    Standing Status:   Future    Expected Date:   02/01/2024    Expiration Date:   01/13/2025   CMP (Cancer Center only)    Standing Status:   Future    Expected Date:   02/01/2024    Expiration Date:   01/13/2025   Follow-up in 3 months. All questions were answered. The patient knows to call the clinic with any problems, questions or concerns.  Timmy Forbes, MD, PhD [facility] Hematology Oncology 01/14/2024   HISTORY  OF PRESENTING ILLNESS:   Kristine Gilbert is a  63 y.o.  female with PMH listed below was seen in consultation at the request of  Melvinia Stager, MD  for evaluation of recent history of acute left lower extremity DVT, acute right lung pulmonary embolism.  Patient reports that for the past 4 months, patient is in pain experiencing worsening of shortness of breath and chest tightness, weakness after exertion He presented to the emergency room for evaluation.  12/30/2023, patient underwent left unilateral lower extremity ultrasound showed DVT in a left peroneal vein below the knee.  Patient was started on Eliquis  starting dose 10 mg twice daily.  01/03/2024, patient presented emergency room due to increasing shortness of breath, chest tightness and also she feels that left-sided weakness is worse after the diagnosis of left lower  extremity blood clot.  In the emergency room, CT chest angiogram showed right segmental and subsegmental pulmonary embolism. MRI brain was negative for acute stroke. Patient was started on IV heparin  drip. Vascular surgeon was consulted and recommended no mechanical thrombectomy. Repeat lower extremity DVT shows stable left peroneal DVT with no progression. Eliquis  failure was felt to be unlikely given her history of ongoing shortness of breath, and pulmonary embolism may have present at the time of left leg DVT diagnosis.  Decision was made to continue on Eliquis  at discharge.  For left sided weakness, patient has history of stroke.  MRI of the brain was negative for stroke.  Patient has history of long-haul COVID.  Today patient was referred to establish care with hematology. Since discharge from the hospital, patient reports that her shortness of breath has improved.  She still has left-sided weakness.  She was accompanied by sister.  Patient reported history of left lower extremity DVT remotely, she cannot remember details.  She recalls being on Coumadin for period of time.   MEDICAL HISTORY:  Past Medical History:  Diagnosis Date   Asthma    Diabetes mellitus without complication (HCC)    DVT (deep venous thrombosis) (HCC) 12/31/2023   on eliquis    Hiatal hernia    Hypercholesteremia    Hypertension    Long COVID     SURGICAL HISTORY: Past Surgical History:  Procedure Laterality Date   CHOLECYSTECTOMY      SOCIAL HISTORY: Social History   Socioeconomic History   Marital status: Single    Spouse name: Not on file   Number of children: Not on file   Years of education: Not on file   Highest education level: Not on file  Occupational History   Not on file  Tobacco Use   Smoking status: Never   Smokeless tobacco: Never  Vaping Use   Vaping status: Never Used  Substance and Sexual Activity   Alcohol use: No   Drug use: Not Currently   Sexual activity: Not  Currently    Birth control/protection: Surgical    Comment: Ablation  Other Topics Concern   Not on file  Social History Narrative   Not on file   Social Drivers of Health   Financial Resource Strain: Low Risk  (01/12/2024)   Received from [facility]   Overall Financial Resource Strain (CARDIA)    Difficulty of Paying Living Expenses: Not very hard  Food Insecurity: No Food Insecurity (01/14/2024)   Hunger Vital Sign    Worried About Running Out of Food in the Last Year: Never true    Ran Out of Food in the Last Year: Never true  Recent Concern: Food Insecurity - Food Insecurity Present (01/04/2024)   Hunger Vital Sign    Worried About Running Out of Food in the Last Year: Sometimes true    Ran Out of Food in the Last Year: Sometimes true  Transportation Needs: No Transportation Needs (01/12/2024)   Received from [facility] - Transportation    In the past 12 months, has lack of transportation kept you from medical appointments or from getting medications?: No    Lack of Transportation (Non-Medical): No  Recent Concern: Transportation Needs - Unmet Transportation Needs (01/04/2024)   PRAPARE - Transportation    Lack of Transportation (Medical): Yes    Lack of Transportation (Non-Medical): Yes  Physical Activity: Inactive (03/09/2023)   Received from [facility]   Exercise Vital Sign    Days of Exercise per Week: 0 days    Minutes of Exercise per Session: 0 min  Stress: Stress Concern Present (03/09/2023)   Received from [facility] - Occupational Stress Questionnaire    Feeling of Stress : To some extent  Social Connections: Moderately Isolated (03/09/2023)   Received from [facility]   Social Connection and Isolation Panel [NHANES]    Frequency of Communication with Friends and Family: More than three times a week    Frequency of Social  Gatherings with Friends and Family: Never    Attends Religious Services: More than 4 times per year    Active Member of [employer] or Organizations: No    Attends Banker Meetings: Never    Marital Status: Divorced  Catering manager Violence: Not At Risk (01/14/2024)   Humiliation, Afraid, Rape, and Kick questionnaire    Fear of Current or Ex-Partner: No    Emotionally Abused: No    Physically Abused: No    Sexually Abused: No    FAMILY HISTORY: Family History  Problem Relation Age of Onset   Diabetes Mother    Cancer Mother    Diabetes Father    Throat cancer Father    Cancer Maternal Aunt    Cancer Maternal Uncle     ALLERGIES:  is allergic to codeine and sulfamethoxazole-trimethoprim.  MEDICATIONS:  Current Outpatient Medications  Medication Sig Dispense Refill   albuterol  (PROVENTIL  HFA;VENTOLIN  HFA) 108 (90 Base) MCG/ACT inhaler Inhale 2 puffs into the lungs every 6 (six) hours as needed for wheezing or shortness of breath. 1 Inhaler 2   apixaban  (ELIQUIS ) 5 MG TABS tablet Take 10 mg by mouth 2 (two) times daily.     atorvastatin  (LIPITOR) 40 MG tablet Take 40 mg by mouth daily.     cholecalciferol  (VITAMIN D ) 1000 units tablet Take 1,000 Units by mouth daily.     estradiol  (ESTRACE ) 0.1 MG/GM vaginal cream PLACE 0.25 APPLICATORFUL VAGINALLY AT BEDTIME 85 g 3   fluticasone  (FLONASE ) 50 MCG/ACT nasal spray Place 1 spray into both nostrils daily as needed for allergies or rhinitis.     gabapentin  (NEURONTIN ) 100 MG capsule Take 100 mg by mouth 2 (two) times daily.     ipratropium (ATROVENT) 0.03 % nasal spray Place 1 spray into the nose 2 (two) times daily as needed for rhinitis.     ipratropium-albuterol  (DUONEB) 0.5-2.5 (3) MG/3ML SOLN Inhale 3 mLs into the lungs 4 (four) times daily as needed (Wheezing).     JARDIANCE  25 MG TABS tablet Take 25 mg by mouth daily.     levocetirizine (  XYZAL) 5 MG tablet Take 5 mg by mouth every evening.     metFORMIN (GLUCOPHAGE) 500  MG tablet Take 1,000 mg by mouth 2 (two) times daily with a meal.     omeprazole (PRILOSEC) 40 MG capsule Take 40 mg by mouth daily.     OZEMPIC, 2 MG/DOSE, 8 MG/3ML SOPN Inject 2 mg into the skin once a week.     pioglitazone  (ACTOS ) 15 MG tablet Take 15 mg by mouth daily.     rizatriptan (MAXALT-MLT) 10 MG disintegrating tablet Take 10 mg by mouth as needed for migraine. May repeat in 2 hours if needed     sertraline  (ZOLOFT ) 50 MG tablet Take 75 mg by mouth daily.     TRELEGY ELLIPTA 100-62.5-25 MCG/ACT AEPB Inhale 1 puff into the lungs daily.     valsartan -hydrochlorothiazide  (DIOVAN -HCT) 320-12.5 MG tablet Take 1 tablet by mouth daily.     vitamin B-12 (CYANOCOBALAMIN ) 1000 MCG tablet Take 1,000 mcg by mouth daily.     Vitamin D , Ergocalciferol , (DRISDOL) 1.25 MG (50000 UNIT) CAPS capsule Take 50,000 Units by mouth once a week.     No current facility-administered medications for this visit.    Review of Systems  Constitutional:  Negative for appetite change, chills, fatigue and fever.  HENT:   Negative for hearing loss and voice change.   Eyes:  Negative for eye problems.  Respiratory:  Positive for shortness of breath. Negative for chest tightness and cough.   Cardiovascular:  Negative for chest pain.  Gastrointestinal:  Negative for abdominal distention, abdominal pain and blood in stool.  Endocrine: Negative for hot flashes.  Genitourinary:  Negative for difficulty urinating and frequency.   Musculoskeletal:  Negative for arthralgias.  Skin:  Negative for itching and rash.  Neurological:  Positive for extremity weakness.  Hematological:  Negative for adenopathy.  Psychiatric/Behavioral:  Negative for confusion.    PHYSICAL EXAMINATION: ECOG PERFORMANCE STATUS: 1 - Symptomatic but completely ambulatory Vitals:   01/14/24 1534 01/14/24 1550  BP: 138/80 122/80  Pulse: (!) 105   Resp: 18   Temp: (!) 97.1 F (36.2 C)   SpO2: 97%    Filed Weights   01/14/24 1534  Weight:  155 lb 8 oz (70.5 kg)    Physical Exam Constitutional:      General: She is not in acute distress.    Comments: Patient walks with a walker  HENT:     Head: Normocephalic and atraumatic.  Eyes:     General: No scleral icterus. Cardiovascular:     Rate and Rhythm: Normal rate and regular rhythm.     Heart sounds: Normal heart sounds.  Pulmonary:     Effort: Pulmonary effort is normal. No respiratory distress.     Breath sounds: No wheezing.  Abdominal:     General: Bowel sounds are normal. There is no distension.     Palpations: Abdomen is soft.  Musculoskeletal:        General: No deformity. Normal range of motion.     Cervical back: Normal range of motion and neck supple.  Skin:    General: Skin is warm and dry.     Findings: No erythema or rash.  Neurological:     Mental Status: She is alert and oriented to person, place, and time. Mental status is at baseline.     Motor: Weakness present.  Psychiatric:        Mood and Affect: Mood normal.     LABORATORY DATA:  I have reviewed the data as listed    Latest Ref Rng & Units 01/03/2024    4:14 PM 04/07/2016   10:01 PM  CBC  WBC 4.0 - 10.5 K/uL 5.7  6.4   Hemoglobin 12.0 - 15.0 g/dL 56.2  13.0   Hematocrit 36.0 - 46.0 % 46.5  44.6   Platelets 150 - 400 K/uL 163  188       Latest Ref Rng & Units 01/03/2024    4:14 PM 12/22/2023    1:04 PM 04/07/2016   10:01 PM  CMP  Glucose 70 - 99 mg/dL 865   784   BUN 8 - 23 mg/dL 17   10   Creatinine 6.96 - 1.00 mg/dL 2.95  2.84  1.32   Sodium 135 - 145 mmol/L 133   138   Potassium 3.5 - 5.1 mmol/L 4.3   3.6   Chloride 98 - 111 mmol/L 102   101   CO2 22 - 32 mmol/L 20   27   Calcium  8.9 - 10.3 mg/dL 8.8   9.5   Total Protein 6.5 - 8.1 g/dL   7.0   Total Bilirubin 0.3 - 1.2 mg/dL   1.0   Alkaline Phos 38 - 126 U/L   49   AST 15 - 41 U/L   67   ALT 14 - 54 U/L   62       RADIOGRAPHIC STUDIES: I have personally reviewed the radiological images as listed and agreed with the  findings in the report. CT HEAD WO CONTRAST ( ) Result Date: 01/08/2024 CLINICAL DATA:  Provided history: Acute intractable headache, unspecified headache type. EXAM: CT HEAD WITHOUT CONTRAST TECHNIQUE: Contiguous axial images were obtained from the base of the skull through the vertex without intravenous contrast. RADIATION DOSE REDUCTION: This exam was performed according to the departmental dose-optimization program which includes automated exposure control, adjustment of the mA and/or kV according to patient size and/or use of iterative reconstruction technique. COMPARISON:  Brain MRI 01/03/2024.  Head CT 01/03/2024. FINDINGS: Brain: Cerebral volume is normal. There is no acute intracranial hemorrhage. No demarcated cortical infarct. No extra-axial fluid collection. No evidence of an intracranial mass. No midline shift. Vascular: No hyperdense vessel.  Atherosclerotic calcifications. Skull: No calvarial fracture or aggressive osseous lesion. Sinuses/Orbits: No mass or acute finding within the imaged orbits. No significant paranasal sinus disease at the imaged levels. IMPRESSION: No evidence of an acute intracranial abnormality. Electronically Signed   By: Bascom Lily D.O.   On: 01/08/2024 12:06   US  Venous Img Lower Bilateral (DVT) Result Date: 01/04/2024 CLINICAL DATA:  Follow up DVT EXAM: BILATERAL LOWER EXTREMITY VENOUS DOPPLER ULTRASOUND TECHNIQUE: Gray-scale sonography with graded compression, as well as color Doppler and duplex ultrasound were performed to evaluate the lower extremity deep venous systems from the level of the common femoral vein and including the common femoral, femoral, profunda femoral, popliteal and calf veins including the posterior tibial, peroneal and gastrocnemius veins when visible. The superficial great saphenous vein was also interrogated. Spectral Doppler was utilized to evaluate flow at rest and with distal augmentation maneuvers in the common femoral, femoral and  popliteal veins. COMPARISON:  Ultrasound 12/30/2023 left FINDINGS: RIGHT LOWER EXTREMITY Common Femoral Vein: No evidence of thrombus. Normal compressibility, respiratory phasicity and response to augmentation. Saphenofemoral Junction: No evidence of thrombus. Normal compressibility and flow on color Doppler imaging. Profunda Femoral Vein: No evidence of thrombus. Normal compressibility and flow on color Doppler imaging. Femoral Vein: No evidence  of thrombus. Normal compressibility, respiratory phasicity and response to augmentation. Popliteal Vein: No evidence of thrombus. Normal compressibility, respiratory phasicity and response to augmentation. Calf Veins: No evidence of thrombus. Normal compressibility and flow on color Doppler imaging. Superficial Great Saphenous Vein: No evidence of thrombus. Normal compressibility. Venous Reflux:  None. Other Findings:  None. LEFT LOWER EXTREMITY Common Femoral Vein: No evidence of thrombus. Normal compressibility, respiratory phasicity and response to augmentation. Saphenofemoral Junction: No evidence of thrombus. Normal compressibility and flow on color Doppler imaging. Profunda Femoral Vein: No evidence of thrombus. Normal compressibility and flow on color Doppler imaging. Femoral Vein: No evidence of thrombus. Normal compressibility, respiratory phasicity and response to augmentation. Popliteal Vein: No evidence of thrombus. Normal compressibility, respiratory phasicity and response to augmentation. Calf Veins: There is incomplete compression of the peroneal vein as well as poor flow. This is occlusive. Similar distribution to previous. Superficial Great Saphenous Vein: No evidence of thrombus. Normal compressibility. Venous Reflux:  None. Other Findings:  None. IMPRESSION: Persistent left below-the-knee peroneal vein DVT. No areas of other DVT in either lower extremity. Electronically Signed   By: Adrianna Horde M.D.   On: 01/04/2024 14:58   ECHOCARDIOGRAM  COMPLETE Result Date: 01/04/2024    ECHOCARDIOGRAM REPORT   Patient Name:   Jalyric Tison Date of Exam: 01/04/2024 Medical Rec #:  [id number]        Height:       67.0 in Accession #:    [id number]       Weight:       151.0 lb Date of Birth:  11/30/1960        BSA:          1.795 m Patient Age:    62 years         BP:           111/69 mmHg Patient Gender: F                HR:           90 bpm. Exam Location:  [facility] Procedure: 2D Echo, Color Doppler and Cardiac Doppler (Both Spectral and Color            Flow Doppler were utilized during procedure). Indications:     I26.09 Pulmonary Embolus  History:         Patient has prior history of Echocardiogram examinations.                  Stroke and Asthma; Risk Factors:Diabetes, Hypertension and                  Dyslipidemia.  Sonographer:     L. Thornton-Maynard Referring Phys:  [id number] SONA PATEL Diagnosing Phys: Antonette Batters MD IMPRESSIONS  1. Left ventricular ejection fraction, by estimation, is 60 to 65%. The left ventricle has normal function. The left ventricle has no regional wall motion abnormalities. Left ventricular diastolic parameters are consistent with Grade I diastolic dysfunction (impaired relaxation).  2. Right ventricular systolic function is normal. The right ventricular size is normal. There is normal pulmonary artery systolic pressure.  3. The mitral valve is normal in structure. Trivial mitral valve regurgitation.  4. The aortic valve is normal in structure. Aortic valve regurgitation is not visualized. FINDINGS  Left Ventricle: Left ventricular ejection fraction, by estimation, is 60 to 65%. The left ventricle has normal function. The left ventricle has no regional wall motion abnormalities. Strain was performed and the global longitudinal strain is indeterminate.  Global longitudinal strain performed but not reported based on interpreter judgement due to suboptimal tracking. The left ventricular internal cavity size was normal in size. There is  no left ventricular hypertrophy. Left ventricular diastolic parameters are consistent with Grade I diastolic dysfunction (impaired relaxation). Right Ventricle: The right ventricular size is normal. No increase in right ventricular wall thickness. Right ventricular systolic function is normal. There is normal pulmonary artery systolic pressure. The tricuspid regurgitant velocity is 2.14 m/s, and  with an assumed right atrial pressure of 3 mmHg, the estimated right ventricular systolic pressure is 21.3 mmHg. Left Atrium: Left atrial size was normal in size. Right Atrium: Right atrial size was normal in size. Pericardium: There is no evidence of pericardial effusion. Mitral Valve: The mitral valve is normal in structure. Trivial mitral valve regurgitation. Tricuspid Valve: The tricuspid valve is normal in structure. Tricuspid valve regurgitation is mild. Aortic Valve: The aortic valve is normal in structure. Aortic valve regurgitation is not visualized. Aortic valve mean gradient measures 3.5 mmHg. Aortic valve peak gradient measures 5.8 mmHg. Aortic valve area, by VTI measures 1.70 cm. Pulmonic Valve: The pulmonic valve was normal in structure. Pulmonic valve regurgitation is not visualized. Aorta: The ascending aorta was not well visualized. IAS/Shunts: No atrial level shunt detected by color flow Doppler. Additional Comments: 3D was performed not requiring image post processing on an independent workstation and was indeterminate.  LEFT VENTRICLE PLAX 2D LVIDd:         3.80 cm     Diastology LVIDs:         2.60 cm     LV e' medial:    6.74 cm/s LV PW:         1.00 cm     LV E/e' medial:  10.3 LV IVS:        1.00 cm     LV e' lateral:   8.05 cm/s LVOT diam:     1.60 cm     LV E/e' lateral: 8.6 LV SV:         38 LV SV Index:   21 LVOT Area:     2.01 cm  LV Volumes (MOD) LV vol d, MOD A2C: 26.4 ml LV vol d, MOD A4C: 32.3 ml LV vol s, MOD A2C: 5.7 ml LV vol s, MOD A4C: 10.1 ml LV SV MOD A2C:     20.7 ml LV SV MOD A4C:      32.3 ml LV SV MOD BP:      21.3 ml RIGHT VENTRICLE             IVC RV Basal diam:  2.70 cm     IVC diam: 0.90 cm RV Mid diam:    1.90 cm RV S prime:     12.50 cm/s TAPSE (M-mode): 1.8 cm LEFT ATRIUM             Index        RIGHT ATRIUM          Index LA diam:        2.50 cm 1.39 cm/m   RA Area:     8.16 cm LA Vol (A2C):   25.3 ml 14.10 ml/m  RA Volume:   13.80 ml 7.69 ml/m LA Vol (A4C):   21.6 ml 12.04 ml/m LA Biplane Vol: 23.8 ml 13.26 ml/m  AORTIC VALVE                    PULMONIC VALVE AV Area (Vmax):  1.38 cm     PV Vmax:       0.73 m/s AV Area (Vmean):   1.58 cm     PV Peak grad:  2.1 mmHg AV Area (VTI):     1.70 cm AV Vmax:           120.00 cm/s AV Vmean:          85.700 cm/s AV VTI:            0.222 m AV Peak Grad:      5.8 mmHg AV Mean Grad:      3.5 mmHg LVOT Vmax:         82.10 cm/s LVOT Vmean:        67.500 cm/s LVOT VTI:          0.187 m LVOT/AV VTI ratio: 0.84  AORTA Ao Root diam: 3.00 cm Ao Asc diam:  3.20 cm MITRAL VALVE                TRICUSPID VALVE MV Area (PHT): 5.27 cm     TR Peak grad:   18.3 mmHg MV Decel Time: 144 msec     TR Vmax:        214.00 cm/s MV E velocity: 69.20 cm/s MV A velocity: 103.00 cm/s  SHUNTS MV E/A ratio:  0.67         Systemic VTI:  0.19 m                             Systemic Diam: 1.60 cm Antonette Batters MD Electronically signed by Antonette Batters MD Signature Date/Time: 01/04/2024/11:42:42 AM    Final    MR BRAIN WO CONTRAST Result Date: 01/03/2024 CLINICAL DATA:  Neuro deficit, acute, stroke suspected EXAM: MRI HEAD WITHOUT CONTRAST TECHNIQUE: Multiplanar, multiecho pulse sequences of the brain and surrounding structures were obtained without intravenous contrast. COMPARISON:  None Available. FINDINGS: Brain: No acute infarction, hemorrhage, hydrocephalus, extra-axial collection or mass lesion. Vascular: Normal flow voids. Skull and upper cervical spine: Normal marrow signal. Sinuses/Orbits: Negative. IMPRESSION: No evidence of acute intracranial  abnormality. Electronically Signed   By: Stevenson Elbe M.D.   On: 01/03/2024 19:16   CT Angio Chest PE W and/or Wo Contrast Addendum Date: 01/03/2024 ADDENDUM REPORT: 01/03/2024 17:15 ADDENDUM: These results were called by telephone at the time of interpretation on 01/03/2024 at 5:15 pm to provider Bryson Carbine , who verbally acknowledged these results. Electronically Signed   By: Morgane  Naveau M.D.   On: 01/03/2024 17:15   Result Date: 01/03/2024 CLINICAL DATA:  Pulmonary embolism (PE) suspected, high prob. CP and dizziness since 2 days ago, has known DVT to L leg, on Eliquis . Also feels SOB. Pt slow to answer questions. Pt has unlabored breathing, skin dr Laurita Porta: CT ANGIOGRAPHY CHEST WITH CONTRAST TECHNIQUE: Multidetector CT imaging of the chest was performed using the standard protocol during bolus administration of intravenous contrast. Multiplanar CT image reconstructions and MIPs were obtained to evaluate the vascular anatomy. RADIATION DOSE REDUCTION: This exam was performed according to the departmental dose-optimization program which includes automated exposure control, adjustment of the mA and/or kV according to patient size and/or use of iterative reconstruction technique. CONTRAST:  75mL OMNIPAQUE  IOHEXOL  350 MG/ML SOLN COMPARISON:  None Available. FINDINGS: Cardiovascular: Satisfactory opacification of the pulmonary arteries to the segmental level. Right lower lobe segmental subsegmental and right middle lobe segmental pulmonary artery filling defects. The main pulmonary artery is  normal in caliber. Normal heart size. No significant pericardial effusion. The thoracic aorta is normal in caliber. Trace atherosclerotic plaque of the thoracic aorta. No definite coronary artery calcifications. Trace mitral annular calcification. Mediastinum/Nodes: No enlarged mediastinal, hilar, or axillary lymph nodes. Thyroid gland, trachea, and esophagus demonstrate no significant findings. Lungs/Pleura: No focal  consolidation. No pulmonary nodule. No pulmonary mass. No pleural effusion. No pneumothorax. Upper Abdomen: Status post cholecystectomy. Musculoskeletal: No chest wall abnormality. No suspicious lytic or blastic osseous lesions. No acute displaced fracture. Review of the MIP images confirms the above findings. IMPRESSION: Right lower lobe segmental/subsegmental and right middle lobe segmental pulmonary emboli. No associated right heart strain or pulmonary infarction. Electronically Signed: By: Morgane  Naveau M.D. 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 Also feels SOB. Pt slow to answer questions. Pt has unlabored breathing, skin EXAM: CT HEAD WITHOUT CONTRAST TECHNIQUE: Contiguous axial images were obtained from the base of the skull through the vertex without intravenous contrast. RADIATION DOSE REDUCTION: This exam was performed according to the departmental dose-optimization program which includes automated exposure control, adjustment of the mA and/or kV according to patient size and/or use of iterative reconstruction technique. COMPARISON:  CT head 04/07/2016 FINDINGS: Brain: No evidence of large-territorial acute infarction. No parenchymal hemorrhage. No mass lesion. No extra-axial collection. No mass effect or midline shift. No hydrocephalus. Basilar cisterns are patent. Vascular: No hyperdense vessel. Skull: No acute fracture or focal lesion. Sinuses/Orbits: Paranasal sinuses and mastoid air cells are clear. The orbits are unremarkable. Other: None. IMPRESSION: No acute intracranial abnormality. Electronically Signed   By: Morgane  Naveau M.D.   On: 01/03/2024 17:04   DG Chest 2 View Result Date: 01/03/2024 CLINICAL DATA:  Chest pain EXAM: CHEST - 2 VIEW COMPARISON:  X-ray 11/08/2022 and older. CT chest contrast  12/22/2023. FINDINGS: Underinflation. No consolidation, pneumothorax or effusion. No edema. Normal cardiopericardial silhouette. Overlapping cardiac leads. IMPRESSION: Underinflation.  No acute cardiopulmonary disease. Electronically Signed   By: Adrianna Horde M.D.   On: 01/03/2024 16:27   US  Venous Img Lower Unilateral Left (DVT) Result Date: 12/30/2023 CLINICAL DATA:  Lower extremity pain EXAM: LEFT LOWER EXTREMITY VENOUS DOPPLER ULTRASOUND TECHNIQUE: Gray-scale sonography with compression, as well as color and duplex ultrasound, were performed to evaluate the deep venous system(s) from the level of the common femoral vein through the popliteal and proximal calf veins. COMPARISON:  None Available. FINDINGS: VENOUS Normal compressibility of the common femoral, superficial femoral, and popliteal veins. Visualized portions of profunda femoral vein and great saphenous vein unremarkable. A left peroneal vein below the knee is identified which is noncompressible. OTHER None. Limitations: none IMPRESSION: 1. DVT in a left peroneal vein below the knee. These results will be called to the ordering clinician or representative by the Radiologist Assistant, and communication documented in the PACS or [employer]. Electronically Signed   By: Reagan Camera M.D.   On: 12/30/2023 16:38   CT CHEST W CONTRAST Result Date: 12/22/2023 CLINICAL DATA:  Chest pain and shortness of breath. EXAM: CT CHEST WITH CONTRAST TECHNIQUE: Multidetector CT imaging of the chest was performed during intravenous contrast administration. RADIATION DOSE REDUCTION: This exam was performed according to the departmental dose-optimization program which includes automated exposure control, adjustment of the mA and/or kV according to patient size and/or  use of iterative reconstruction technique. CONTRAST:  75mL OMNIPAQUE  IOHEXOL  300 MG/ML  SOLN COMPARISON:  Chest x-ray 11/08/2022 FINDINGS: Cardiovascular: No significant vascular findings. Normal  heart size. No pericardial effusion. Mediastinum/Nodes: No enlarged mediastinal, hilar, or axillary lymph nodes. Thyroid gland, trachea, and esophagus demonstrate no significant findings. Small hiatal hernia. Lungs/Pleura: The lungs are clear of an acute process. No infiltrates, edema or effusions. No pulmonary lesions or pulmonary nodules. No pleural nodules. The tracheobronchial tree is unremarkable. No interstitial lung disease or bronchiectasis. Upper Abdomen: Status post cholecystectomy. No biliary dilatation. 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 Musculoskeletal: No chest wall abnormality. No acute or significant osseous findings. IMPRESSION: 1. No acute pulmonary findings, pulmonary lesions or nodules. 2. No mediastinal or hilar mass or adenopathy. 3. Small hiatal hernia. 4. 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 Electronically Signed   By: Marrian Siva M.D.   On: 12/22/2023 14:14

## 2024-01-14 NOTE — Assessment & Plan Note (Signed)
 See above

## 2024-01-22 ENCOUNTER — Other Ambulatory Visit: Payer: Self-pay | Admitting: Internal Medicine

## 2024-01-22 ENCOUNTER — Ambulatory Visit
Admission: RE | Admit: 2024-01-22 | Discharge: 2024-01-22 | Disposition: A | Discharge status: Home / Self Care | Source: Ambulatory Visit | Attending: Internal Medicine | Admitting: Internal Medicine

## 2024-01-22 DIAGNOSIS — M79604 Pain in right leg: Secondary | ICD-10-CM | POA: Diagnosis present

## 2024-01-25 ENCOUNTER — Other Ambulatory Visit

## 2024-02-01 ENCOUNTER — Inpatient Hospital Stay

## 2024-02-01 DIAGNOSIS — I2699 Other pulmonary embolism without acute cor pulmonale: Secondary | ICD-10-CM | POA: Diagnosis not present

## 2024-02-01 DIAGNOSIS — I82452 Acute embolism and thrombosis of left peroneal vein: Secondary | ICD-10-CM

## 2024-02-01 LAB — CMP (CANCER CENTER ONLY)
ALT: 19 U/L (ref 0–44)
AST: 21 U/L (ref 15–41)
Albumin: 4.1 g/dL (ref 3.5–5.0)
Alkaline Phosphatase: 55 U/L (ref 38–126)
Anion gap: 11 (ref 5–15)
BUN: 15 mg/dL (ref 8–23)
CO2: 22 mmol/L (ref 22–32)
Calcium: 9.4 mg/dL (ref 8.9–10.3)
Chloride: 101 mmol/L (ref 98–111)
Creatinine: 0.8 mg/dL (ref 0.44–1.00)
GFR, Estimated: >60 mL/min (ref >60)
Glucose, Bld: 166 mg/dL — ABNORMAL HIGH (ref 70–99)
Potassium: 3.9 mmol/L (ref 3.5–5.1)
Sodium: 134 mmol/L — ABNORMAL LOW (ref 135–145)
Total Bilirubin: 1.1 mg/dL (ref 0.0–1.2)
Total Protein: 7.1 g/dL (ref 6.5–8.1)

## 2024-02-01 LAB — CBC WITH DIFFERENTIAL/PLATELET
Abs Immature Granulocytes: 0.01 10*3/uL (ref 0.00–0.07)
Basophils Absolute: 0 10*3/uL (ref 0.0–0.1)
Basophils Relative: 1 %
Eosinophils Absolute: 0 10*3/uL (ref 0.0–0.5)
Eosinophils Relative: 1 %
HCT: 47.4 % — ABNORMAL HIGH (ref 36.0–46.0)
Hemoglobin: 15.9 g/dL — ABNORMAL HIGH (ref 12.0–15.0)
Immature Granulocytes: 0 %
Lymphocytes Relative: 43 %
Lymphs Abs: 2.2 10*3/uL (ref 0.7–4.0)
MCH: 30.6 pg (ref 26.0–34.0)
MCHC: 33.5 g/dL (ref 30.0–36.0)
MCV: 91.3 fL (ref 80.0–100.0)
Monocytes Absolute: 0.4 10*3/uL (ref 0.1–1.0)
Monocytes Relative: 9 %
Neutro Abs: 2.4 10*3/uL (ref 1.7–7.7)
Neutrophils Relative %: 46 %
Platelets: 216 10*3/uL (ref 150–400)
RBC: 5.19 MIL/uL — ABNORMAL HIGH (ref 3.87–5.11)
RDW: 13.2 % (ref 11.5–15.5)
WBC: 5 10*3/uL (ref 4.0–10.5)
nRBC: 0 % (ref 0.0–0.2)

## 2024-02-01 LAB — ANTITHROMBIN III: AntiThromb III Func: 120 % (ref 75–120)

## 2024-02-02 LAB — BETA-2-GLYCOPROTEIN I ABS, IGG/M/A
Beta-2 Glyco I IgG: <9 GPI IgG units (ref 0–20)
Beta-2-Glycoprotein I IgA: <9 GPI IgA units (ref 0–25)
Beta-2-Glycoprotein I IgM: <9 GPI IgM units (ref 0–32)

## 2024-02-02 LAB — DRVVT MIX: dRVVT Mix: 46 s — ABNORMAL HIGH (ref 0.0–40.4)

## 2024-02-02 LAB — DRVVT CONFIRM: dRVVT Confirm: 1 ratio (ref 0.8–1.2)

## 2024-02-04 LAB — FACTOR 5 LEIDEN

## 2024-02-05 LAB — PROTHROMBIN GENE MUTATION

## 2024-02-05 LAB — PNH PROFILE (-HIGH SENSITIVITY)

## 2024-02-09 LAB — ANTIPHOSPHOLIPID SYNDROME PROF
Anticardiolipin IgG: <9 GPL U/mL (ref 0–14)
Anticardiolipin IgM: <9 [MPL'U]/mL (ref 0–12)
DRVVT: 67 s — ABNORMAL HIGH (ref 0.0–47.0)
PTT Lupus Anticoagulant: 30.5 s (ref 0.0–43.5)

## 2024-04-15 ENCOUNTER — Ambulatory Visit
Admission: RE | Admit: 2024-04-15 | Discharge: 2024-04-15 | Disposition: A | Discharge status: Home / Self Care | Source: Ambulatory Visit | Attending: Oncology | Admitting: Oncology

## 2024-04-15 DIAGNOSIS — I82452 Acute embolism and thrombosis of left peroneal vein: Secondary | ICD-10-CM | POA: Diagnosis present

## 2024-04-21 ENCOUNTER — Inpatient Hospital Stay: Attending: Oncology

## 2024-04-21 ENCOUNTER — Inpatient Hospital Stay (HOSPITAL_BASED_OUTPATIENT_CLINIC_OR_DEPARTMENT_OTHER): Admitting: Oncology

## 2024-04-21 ENCOUNTER — Inpatient Hospital Stay

## 2024-04-21 ENCOUNTER — Encounter: Payer: Self-pay | Admitting: Oncology

## 2024-04-21 VITALS — BP 120/73 | HR 92 | Temp 97.0°F | Resp 18 | Wt 159.1 lb

## 2024-04-21 DIAGNOSIS — Z86718 Personal history of other venous thrombosis and embolism: Secondary | ICD-10-CM | POA: Insufficient documentation

## 2024-04-21 DIAGNOSIS — D751 Secondary polycythemia: Secondary | ICD-10-CM | POA: Diagnosis not present

## 2024-04-21 DIAGNOSIS — I82452 Acute embolism and thrombosis of left peroneal vein: Secondary | ICD-10-CM

## 2024-04-21 DIAGNOSIS — I2699 Other pulmonary embolism without acute cor pulmonale: Secondary | ICD-10-CM | POA: Diagnosis not present

## 2024-04-21 DIAGNOSIS — Z86711 Personal history of pulmonary embolism: Secondary | ICD-10-CM | POA: Insufficient documentation

## 2024-04-21 DIAGNOSIS — Z79899 Other long term (current) drug therapy: Secondary | ICD-10-CM | POA: Insufficient documentation

## 2024-04-21 DIAGNOSIS — Z7901 Long term (current) use of anticoagulants: Secondary | ICD-10-CM | POA: Diagnosis not present

## 2024-04-21 LAB — CMP (CANCER CENTER ONLY)
ALT: 16 U/L (ref 0–44)
AST: 26 U/L (ref 15–41)
Albumin: 4 g/dL (ref 3.5–5.0)
Alkaline Phosphatase: 60 U/L (ref 38–126)
Anion gap: 13 (ref 5–15)
BUN: 20 mg/dL (ref 8–23)
CO2: 18 mmol/L — ABNORMAL LOW (ref 22–32)
Calcium: 9.4 mg/dL (ref 8.9–10.3)
Chloride: 99 mmol/L (ref 98–111)
Creatinine: 1.19 mg/dL — ABNORMAL HIGH (ref 0.44–1.00)
GFR, Estimated: 52 mL/min — ABNORMAL LOW (ref >60)
Glucose, Bld: 193 mg/dL — ABNORMAL HIGH (ref 70–99)
Potassium: 3.8 mmol/L (ref 3.5–5.1)
Sodium: 130 mmol/L — ABNORMAL LOW (ref 135–145)
Total Bilirubin: 0.7 mg/dL (ref 0.0–1.2)
Total Protein: 6.6 g/dL (ref 6.5–8.1)

## 2024-04-21 LAB — CBC WITH DIFFERENTIAL (CANCER CENTER ONLY)
Abs Immature Granulocytes: 0.02 K/uL (ref 0.00–0.07)
Basophils Absolute: 0 K/uL (ref 0.0–0.1)
Basophils Relative: 0 %
Eosinophils Absolute: 0 K/uL (ref 0.0–0.5)
Eosinophils Relative: 0 %
HCT: 46.5 % — ABNORMAL HIGH (ref 36.0–46.0)
Hemoglobin: 15.2 g/dL — ABNORMAL HIGH (ref 12.0–15.0)
Immature Granulocytes: 0 %
Lymphocytes Relative: 33 %
Lymphs Abs: 1.9 K/uL (ref 0.7–4.0)
MCH: 29.9 pg (ref 26.0–34.0)
MCHC: 32.7 g/dL (ref 30.0–36.0)
MCV: 91.5 fL (ref 80.0–100.0)
Monocytes Absolute: 0.5 K/uL (ref 0.1–1.0)
Monocytes Relative: 9 %
Neutro Abs: 3.4 K/uL (ref 1.7–7.7)
Neutrophils Relative %: 58 %
Platelet Count: 199 K/uL (ref 150–400)
RBC: 5.08 MIL/uL (ref 3.87–5.11)
RDW: 12.7 % (ref 11.5–15.5)
WBC Count: 5.8 K/uL (ref 4.0–10.5)
nRBC: 0 % (ref 0.0–0.2)

## 2024-04-21 LAB — D-DIMER, QUANTITATIVE: D-Dimer, Quant: 0.81 ug{FEU}/mL — ABNORMAL HIGH (ref 0.00–0.50)

## 2024-04-21 MED ORDER — APIXABAN 5 MG PO TABS: 5.0000 mg | ORAL_TABLET | Freq: Two times a day (BID) | ORAL | 3 refills | Status: DC

## 2024-04-21 NOTE — Assessment & Plan Note (Signed)
 Continue Eliquis 5 mg twice daily

## 2024-04-21 NOTE — Assessment & Plan Note (Addendum)
 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 Hypercoagulable workup showed negative antiphospholipid antibodies, negative beta-2  glycoprotein antibodies, negative factor V gene mutation, negative prothrombin gene mutation, negative PNH normal Antithrombin III   repeat left lower extremity ultrasound after 3 months of anticoagulation showed subacute DVT. Continue Eliquis  5 mg twice daily. Repeat CT chest angiogram and lower extremity ultrasound in 3 months.

## 2024-04-21 NOTE — Progress Notes (Signed)
 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 Hypercoagulable workup showed negative antiphospholipid antibodies, negative beta-2  glycoprotein antibodies, negative factor V gene mutation, negative prothrombin gene mutation, negative PNH normal Antithrombin III   repeat left lower extremity ultrasound after 3 months of anticoagulation showed subacute DVT. Continue Eliquis  5 mg twice daily. Repeat CT chest angiogram and lower extremity ultrasound in 3 months.  History of DVT (deep vein thrombosis) Continue Eliquis  5 mg twice daily   Orders Placed This Encounter  Procedures   US  Venous Img Lower Unilateral Left (DVT)    Standing Status:   Future    Expected Date:   07/26/2024    Expiration Date:   04/21/2025    Reason for Exam (SYMPTOM  OR DIAGNOSIS REQUIRED):   DVT follow up    Preferred imaging location?:   [facility]   CT Angio Chest Pulmonary Embolism (PE) W or WO Contrast    Standing Status:   Future    Expected Date:   07/26/2024    Expiration Date:   04/21/2025    If indicated for the ordered procedure, I authorize the administration of contrast media per Radiology protocol:   Yes    Does the patient have a contrast media/X-ray dye allergy?:   No    Preferred imaging location?:   [facility]   JAK2 [id number] rfx CALR/MPL/E12-15    Standing Status:   Future    Number of Occurrences:   1    Expected Date:   04/21/2024    Expiration Date:   07/20/2024   BCR-ABL1 FISH    Standing Status:   Future    Number of Occurrences:   1    Expected Date:   04/21/2024    Expiration Date:   07/20/2024   Erythropoietin     Standing Status:   Future     Number of Occurrences:   1    Expected Date:   04/21/2024    Expiration Date:   07/20/2024   Carbon Monoxide, Blood    Standing Status:   Future    Number of Occurrences:   1    Expected Date:   04/21/2024    Expiration Date:   07/20/2024   CBC with Differential (Cancer Center Only)    Standing Status:   Future    Expected Date:   07/21/2024    Expiration Date:   10/19/2024   CMP (Cancer Center only)    Standing Status:   Future    Expected Date:   07/21/2024    Expiration Date:   10/19/2024   Follow-up in 3 months. All questions were answered. The patient knows to call the clinic with any problems, questions or concerns.  Zelphia Cap, MD, PhD [facility] Hematology Oncology 04/21/2024   HISTORY OF PRESENTING ILLNESS:   Gesenia Bantz is a  63 y.o.  female with PMH listed below was seen in consultation at the request of  Sherial Bail, MD  for evaluation of recent history of acute left lower extremity DVT, acute right lung pulmonary embolism.  Patient reports that for the past 4 months, patient is in pain experiencing worsening of shortness of breath and chest tightness, weakness after  exertion He presented to the emergency room for evaluation.  12/30/2023, patient underwent left unilateral lower extremity ultrasound showed DVT in a left peroneal vein below the knee.  Patient was started on Eliquis  starting dose 10 mg twice daily.  01/03/2024, patient presented emergency room due to increasing shortness of breath, chest tightness and also she feels that left-sided weakness is worse after the diagnosis of left lower extremity blood clot.  In the emergency room, CT chest angiogram showed right segmental and subsegmental pulmonary embolism. MRI brain was negative for acute stroke. Patient was started on IV heparin  drip. Vascular surgeon was consulted and recommended no mechanical thrombectomy. Repeat lower extremity DVT shows stable left peroneal DVT with no progression. Eliquis   failure was felt to be unlikely given her history of ongoing shortness of breath, and pulmonary embolism may have present at the time of left leg DVT diagnosis.  Decision was made to continue on Eliquis  at discharge.  For left sided weakness, patient has history of stroke.  MRI of the brain was negative for stroke.  Patient has history of long-haul COVID.   INTERVAL HISTORY Mckenleigh Tarlton is a 63 y.o. female who has above history reviewed by me today presents for follow up visit for unprovoked pulmonary embolism and left lower extremity DVT. Shortness of breath has improved. No new complaints.  Chronic fatigue unchanged. MEDICAL HISTORY:  Past Medical History:  Diagnosis Date   Asthma    Diabetes mellitus without complication (HCC)    DVT (deep venous thrombosis) (HCC) 12/31/2023   on eliquis    Hiatal hernia    Hypercholesteremia    Hypertension    Long COVID     SURGICAL HISTORY: Past Surgical History:  Procedure Laterality Date   CHOLECYSTECTOMY      SOCIAL HISTORY: Social History   Socioeconomic History   Marital status: Single    Spouse name: Not on file   Number of children: Not on file   Years of education: Not on file   Highest education level: Not on file  Occupational History   Not on file  Tobacco Use   Smoking status: Never   Smokeless tobacco: Never  Vaping Use   Vaping status: Never Used  Substance and Sexual Activity   Alcohol use: No   Drug use: Not Currently   Sexual activity: Not Currently    Birth control/protection: Surgical    Comment: Ablation  Other Topics Concern   Not on file  Social History Narrative   Not on file   Social Drivers of Health   Financial Resource Strain: High Risk (02/04/2024)   Received from [facility]   Overall Financial Resource Strain (CARDIA)    Difficulty of Paying Living Expenses: Very hard  Food Insecurity: Food Insecurity Present (02/04/2024)   Received from [facility]   Hunger Vital Sign    Within the past 12 months, you worried that your food would run out before you got the money to buy more.: Sometimes true    Within the past 12 months, the food you bought just didn't last and you didn't have money to get more.: Sometimes true  Transportation Needs: Unmet Transportation Needs (02/04/2024)   Received from [facility] - Transportation    In the past 12 months, has lack of transportation kept you from medical appointments or from getting medications?: Yes    Lack of Transportation (Non-Medical): Yes  Physical Activity: Insufficiently Active (02/04/2024)   Received  from [facility]   Exercise Vital Sign    On average, how many days per week do you engage in moderate to strenuous exercise (like a brisk walk)?: 2 days    On average, how many minutes do you engage in exercise at this level?: 10 min  Stress: Stress Concern Present (02/04/2024)   Received from [facility] Occupational [facility] - Occupational Stress Questionnaire    Feeling of Stress : To some extent  Social Connections: Moderately Integrated (02/04/2024)   Received from [facility]   Social Connection and Isolation Panel    In a typical week, how many times do you talk on the phone with family, friends, or neighbors?: More than three times a week    How often do you get together with friends or relatives?: Once a week    How often do you attend church or religious services?: More than 4 times per year    Do you belong to any clubs or organizations such as church groups, unions, fraternal or athletic groups, or school groups?: Yes    How often do you attend meetings of the clubs or organizations you belong to?: 1 to 4 times per year    Are you married, widowed, divorced, separated, never married, or living with a partner?: Divorced  Intimate Partner Violence: Not At Risk (01/14/2024)    Humiliation, Afraid, Rape, and Kick questionnaire    Fear of Current or Ex-Partner: No    Emotionally Abused: No    Physically Abused: No    Sexually Abused: No    FAMILY HISTORY: Family History  Problem Relation Age of Onset   Diabetes Mother    Cancer Mother    Diabetes Father    Throat cancer Father    Cancer Maternal Aunt    Cancer Maternal Uncle     ALLERGIES:  is allergic to codeine and sulfamethoxazole-trimethoprim.  MEDICATIONS:  Current Outpatient Medications  Medication Sig Dispense Refill   albuterol  (PROVENTIL  HFA;VENTOLIN  HFA) 108 (90 Base) MCG/ACT inhaler Inhale 2 puffs into the lungs every 6 (six) hours as needed for wheezing or shortness of breath. 1 Inhaler 2   atorvastatin  (LIPITOR) 40 MG tablet Take 40 mg by mouth daily.     cholecalciferol  (VITAMIN D ) 1000 units tablet Take 1,000 Units by mouth daily.     estradiol  (ESTRACE ) 0.1 MG/GM vaginal cream PLACE 0.25 APPLICATORFUL VAGINALLY AT BEDTIME 85 g 3   fluticasone  (FLONASE ) 50 MCG/ACT nasal spray Place 1 spray into both nostrils daily as needed for allergies or rhinitis.     gabapentin  (NEURONTIN ) 100 MG capsule Take 100 mg by mouth 2 (two) times daily.     ipratropium (ATROVENT) 0.03 % nasal spray Place 1 spray into the nose 2 (two) times daily as needed for rhinitis.     ipratropium-albuterol  (DUONEB) 0.5-2.5 (3) MG/3ML SOLN Inhale 3 mLs into the lungs 4 (four) times daily as needed (Wheezing).     JARDIANCE  25 MG TABS tablet Take 25 mg by mouth daily.     levocetirizine (XYZAL) 5 MG tablet Take 5 mg by mouth every evening.     metFORMIN (GLUCOPHAGE) 500 MG tablet Take 1,000 mg by mouth 2 (two) times daily with a meal.     omeprazole (PRILOSEC) 40 MG capsule Take 40 mg by mouth daily.     OZEMPIC, 2 MG/DOSE, 8 MG/3ML SOPN Inject 2 mg into the skin once a week.  pioglitazone  (ACTOS ) 15 MG tablet Take 15 mg by mouth daily.     predniSONE  (DELTASONE ) 5 MG tablet Take by mouth. Take 2 tablets (10 mg total) by  mouth once daily for 2 days, THEN 1 tablet (5 mg total) once daily for 8 days     rizatriptan (MAXALT-MLT) 10 MG disintegrating tablet Take 10 mg by mouth as needed for migraine. May repeat in 2 hours if needed     sertraline  (ZOLOFT ) 50 MG tablet Take 75 mg by mouth daily.     sulfamethoxazole-trimethoprim (BACTRIM DS) 800-160 MG tablet Take 1 tablet by mouth every 12 (twelve) hours. For 7 days     TRELEGY ELLIPTA 100-62.5-25 MCG/ACT AEPB Inhale 1 puff into the lungs daily.     valsartan -hydrochlorothiazide  (DIOVAN -HCT) 320-12.5 MG tablet Take 1 tablet by mouth daily.     vitamin B-12 (CYANOCOBALAMIN ) 1000 MCG tablet Take 1,000 mcg by mouth daily.     Vitamin D , Ergocalciferol , (DRISDOL) 1.25 MG (50000 UNIT) CAPS capsule Take 50,000 Units by mouth once a week.     apixaban  (ELIQUIS ) 5 MG TABS tablet Take 1 tablet (5 mg total) by mouth 2 (two) times daily. 60 tablet 3   No current facility-administered medications for this visit.    Review of Systems  Constitutional:  Negative for appetite change, chills, fatigue and fever.  HENT:   Negative for hearing loss and voice change.   Eyes:  Negative for eye problems.  Respiratory:  Negative for chest tightness, cough and shortness of breath.   Cardiovascular:  Negative for chest pain.  Gastrointestinal:  Negative for abdominal distention, abdominal pain and blood in stool.  Endocrine: Negative for hot flashes.  Genitourinary:  Negative for difficulty urinating and frequency.   Musculoskeletal:  Negative for arthralgias.  Skin:  Negative for itching and rash.  Neurological:  Positive for extremity weakness.  Hematological:  Negative for adenopathy.  Psychiatric/Behavioral:  Negative for confusion.    PHYSICAL EXAMINATION: ECOG PERFORMANCE STATUS: 1 - Symptomatic but completely ambulatory Vitals:   04/21/24 1436  BP: 120/73  Pulse: 92  Resp: 18  Temp: (!) 97 F (36.1 C)  SpO2: 99%   Filed Weights   04/21/24 1436  Weight: 159 lb 1.6  oz (72.2 kg)    Physical Exam Constitutional:      General: She is not in acute distress.    Comments: Patient walks with a walker  HENT:     Head: Normocephalic and atraumatic.  Eyes:     General: No scleral icterus. Cardiovascular:     Rate and Rhythm: Normal rate and regular rhythm.     Heart sounds: Normal heart sounds.  Pulmonary:     Effort: Pulmonary effort is normal. No respiratory distress.     Breath sounds: No wheezing.  Abdominal:     General: Bowel sounds are normal. There is no distension.  Musculoskeletal:        General: No deformity. Normal range of motion.     Cervical back: Normal range of motion and neck supple.  Skin:    General: Skin is warm and dry.     Findings: No erythema or rash.  Neurological:     Mental Status: She is alert and oriented to person, place, and time. Mental status is at baseline.  Psychiatric:        Mood and Affect: Mood normal.     LABORATORY DATA:  I have reviewed the data as listed    Latest Ref Rng &  Units 04/21/2024    2:04 PM 02/01/2024   11:18 AM 01/03/2024    4:14 PM  CBC  WBC 4.0 - 10.5 K/uL 5.8  5.0  5.7   Hemoglobin 12.0 - 15.0 g/dL 84.7  84.0  84.7   Hematocrit 36.0 - 46.0 % 46.5  47.4  46.5   Platelets 150 - 400 K/uL 199  216  163       Latest Ref Rng & Units 04/21/2024    2:04 PM 02/01/2024   11:19 AM 01/03/2024    4:14 PM  CMP  Glucose 70 - 99 mg/dL 806  833  856   BUN 8 - 23 mg/dL 20  15  17    Creatinine 0.44 - 1.00 mg/dL 8.80  9.19  9.21   Sodium 135 - 145 mmol/L 130  134  133   Potassium 3.5 - 5.1 mmol/L 3.8  3.9  4.3   Chloride 98 - 111 mmol/L 99  101  102   CO2 22 - 32 mmol/L 18  22  20    Calcium  8.9 - 10.3 mg/dL 9.4  9.4  8.8   Total Protein 6.5 - 8.1 g/dL 6.6  7.1    Total Bilirubin 0.0 - 1.2 mg/dL 0.7  1.1    Alkaline Phos 38 - 126 U/L 60  55    AST 15 - 41 U/L 26  21    ALT 0 - 44 U/L 16  19        RADIOGRAPHIC STUDIES: I have personally reviewed the radiological images as listed and  agreed with the findings in the report. US  Venous Img Lower Unilateral Left (DVT) Result Date: 04/17/2024 CLINICAL DATA:  Left calf vein DVT, follow-up. EXAM: LEFT LOWER EXTREMITY VENOUS DOPPLER ULTRASOUND TECHNIQUE: Gray-scale sonography with graded compression, as well as color Doppler and duplex ultrasound were performed to evaluate the lower extremity deep venous systems from the level of the common femoral vein and including the common femoral, femoral, profunda femoral, popliteal and calf veins including the posterior tibial, peroneal and gastrocnemius veins when visible. The superficial great saphenous vein was also interrogated. Spectral Doppler was utilized to evaluate flow at rest and with distal augmentation maneuvers in the common femoral, femoral and popliteal veins. COMPARISON:  None Available. FINDINGS: Contralateral Common Femoral Vein: Respiratory phasicity is normal and symmetric with the symptomatic side. No evidence of thrombus. Normal compressibility. Common Femoral Vein: No evidence of thrombus. Normal compressibility, respiratory phasicity and response to augmentation. Saphenofemoral Junction: No evidence of thrombus. Normal compressibility and flow on color Doppler imaging. Profunda Femoral Vein: No evidence of thrombus. Normal compressibility and flow on color Doppler imaging. Femoral Vein: No evidence of thrombus. Normal compressibility, respiratory phasicity and response to augmentation. Popliteal Vein: No evidence of thrombus. Normal compressibility, respiratory phasicity and response to augmentation. Calf Veins: Persistent non opacification of 1 of the paired peroneal veins consistent with subacute DVT. The is the posterior tibial veins remain patent. Superficial Great Saphenous Vein: No evidence of thrombus. Normal compressibility. Venous Reflux:  None. Other Findings:  None. IMPRESSION: Persistent isolated calf vein DVT within 1 of the paired peroneal veins. Electronically Signed    By: Wilkie Lent M.D.   On: 04/17/2024 09:14

## 2024-04-21 NOTE — Progress Notes (Signed)
 Pt here for follow up. No new hematology concerns. Pt is currently on antibiotics for ear infection.

## 2024-04-22 ENCOUNTER — Encounter: Payer: Self-pay | Admitting: Oncology

## 2024-04-22 LAB — CARBON MONOXIDE, BLOOD (PERFORMED AT REF LAB): Carbon Monoxide, Blood: 2.2 % (ref 0.0–3.6)

## 2024-04-23 LAB — ERYTHROPOIETIN: Erythropoietin: 14.3 m[IU]/mL (ref 2.6–18.5)

## 2024-04-26 ENCOUNTER — Encounter: Payer: Self-pay | Admitting: Oncology

## 2024-04-28 LAB — JAK2 V617F RFX CALR/MPL/E12-15

## 2024-04-28 LAB — CALR +MPL + E12-E15  (REFLEX)

## 2024-04-29 LAB — BCR-ABL1 FISH
Cells Analyzed: 200
Cells Counted: 200

## 2024-05-24 ENCOUNTER — Ambulatory Visit
Admission: RE | Admit: 2024-05-24 | Discharge: 2024-05-24 | Disposition: A | Discharge status: Home / Self Care | Source: Ambulatory Visit | Attending: Physician Assistant | Admitting: Physician Assistant

## 2024-05-24 ENCOUNTER — Other Ambulatory Visit: Payer: Self-pay | Admitting: Physician Assistant

## 2024-05-24 DIAGNOSIS — M79604 Pain in right leg: Secondary | ICD-10-CM | POA: Insufficient documentation

## 2024-05-24 DIAGNOSIS — M7989 Other specified soft tissue disorders: Secondary | ICD-10-CM | POA: Diagnosis present

## 2024-06-02 ENCOUNTER — Other Ambulatory Visit: Payer: Self-pay | Admitting: Sports Medicine

## 2024-06-02 DIAGNOSIS — M79604 Pain in right leg: Secondary | ICD-10-CM

## 2024-06-02 DIAGNOSIS — M5416 Radiculopathy, lumbar region: Secondary | ICD-10-CM

## 2024-06-02 DIAGNOSIS — M1711 Unilateral primary osteoarthritis, right knee: Secondary | ICD-10-CM

## 2024-06-02 DIAGNOSIS — M47816 Spondylosis without myelopathy or radiculopathy, lumbar region: Secondary | ICD-10-CM

## 2024-06-05 ENCOUNTER — Ambulatory Visit
Admission: RE | Admit: 2024-06-05 | Discharge: 2024-06-05 | Disposition: A | Discharge status: Home / Self Care | Source: Ambulatory Visit | Attending: Sports Medicine | Admitting: Sports Medicine

## 2024-06-05 DIAGNOSIS — M1711 Unilateral primary osteoarthritis, right knee: Secondary | ICD-10-CM

## 2024-06-05 DIAGNOSIS — M47816 Spondylosis without myelopathy or radiculopathy, lumbar region: Secondary | ICD-10-CM | POA: Insufficient documentation

## 2024-06-05 DIAGNOSIS — M5416 Radiculopathy, lumbar region: Secondary | ICD-10-CM | POA: Insufficient documentation

## 2024-06-05 DIAGNOSIS — M79604 Pain in right leg: Secondary | ICD-10-CM | POA: Diagnosis present

## 2024-06-09 ENCOUNTER — Encounter: Payer: Self-pay | Admitting: Oncology

## 2024-06-09 ENCOUNTER — Emergency Department
Admission: EM | Admit: 2024-06-09 | Discharge: 2024-06-09 | Disposition: A | Discharge status: Home / Self Care | Source: Other Acute Inpatient Hospital | Attending: Emergency Medicine | Admitting: Emergency Medicine

## 2024-06-09 ENCOUNTER — Other Ambulatory Visit: Payer: Self-pay

## 2024-06-09 ENCOUNTER — Emergency Department

## 2024-06-09 DIAGNOSIS — I10 Essential (primary) hypertension: Secondary | ICD-10-CM | POA: Diagnosis not present

## 2024-06-09 DIAGNOSIS — R519 Headache, unspecified: Secondary | ICD-10-CM | POA: Insufficient documentation

## 2024-06-09 DIAGNOSIS — R0789 Other chest pain: Secondary | ICD-10-CM | POA: Diagnosis not present

## 2024-06-09 DIAGNOSIS — J45909 Unspecified asthma, uncomplicated: Secondary | ICD-10-CM | POA: Diagnosis not present

## 2024-06-09 DIAGNOSIS — Z7901 Long term (current) use of anticoagulants: Secondary | ICD-10-CM | POA: Insufficient documentation

## 2024-06-09 DIAGNOSIS — E119 Type 2 diabetes mellitus without complications: Secondary | ICD-10-CM | POA: Insufficient documentation

## 2024-06-09 DIAGNOSIS — R079 Chest pain, unspecified: Secondary | ICD-10-CM

## 2024-06-09 HISTORY — DX: Other pulmonary embolism without acute cor pulmonale: I26.99

## 2024-06-09 LAB — BASIC METABOLIC PANEL WITH GFR
Anion gap: 14 (ref 5–15)
BUN: 22 mg/dL (ref 8–23)
CO2: 24 mmol/L (ref 22–32)
Calcium: 9.6 mg/dL (ref 8.9–10.3)
Chloride: 100 mmol/L (ref 98–111)
Creatinine, Ser: 0.91 mg/dL (ref 0.44–1.00)
GFR, Estimated: >60 mL/min (ref >60)
Glucose, Bld: 186 mg/dL — ABNORMAL HIGH (ref 70–99)
Potassium: 4.3 mmol/L (ref 3.5–5.1)
Sodium: 138 mmol/L (ref 135–145)

## 2024-06-09 LAB — CBC
HCT: 47.8 % — ABNORMAL HIGH (ref 36.0–46.0)
Hemoglobin: 16 g/dL — ABNORMAL HIGH (ref 12.0–15.0)
MCH: 29.8 pg (ref 26.0–34.0)
MCHC: 33.5 g/dL (ref 30.0–36.0)
MCV: 89 fL (ref 80.0–100.0)
Platelets: 235 K/uL (ref 150–400)
RBC: 5.37 MIL/uL — ABNORMAL HIGH (ref 3.87–5.11)
RDW: 13.2 % (ref 11.5–15.5)
WBC: 6.6 K/uL (ref 4.0–10.5)
nRBC: 0 % (ref 0.0–0.2)

## 2024-06-09 LAB — TROPONIN I (HIGH SENSITIVITY): Troponin I (High Sensitivity): <2 ng/L (ref <18)

## 2024-06-09 MED ORDER — HYDROCODONE-ACETAMINOPHEN 5-325 MG PO TABS: 1.0000 | ORAL_TABLET | ORAL | 0 refills | Status: AC | PRN

## 2024-06-09 MED ORDER — KETOROLAC TROMETHAMINE 60 MG/2ML IM SOLN
60.0000 mg | Freq: Once | INTRAMUSCULAR | Status: AC
  Administered 2024-06-09: 60 mg via INTRAMUSCULAR
  Filled 2024-06-09: qty 2

## 2024-06-09 MED ORDER — KETOROLAC TROMETHAMINE 30 MG/ML IJ SOLN: 30.0000 mg | Freq: Once | INTRAMUSCULAR | Status: DC

## 2024-06-09 NOTE — ED Triage Notes (Signed)
 Patient states headache that started last night; this morning BP was 166/105. Also reports dizziness and chest pain.

## 2024-06-09 NOTE — Discharge Instructions (Signed)
 Please use Tylenol  1000 mg every 6 hours as needed for headache/discomfort.  Drink plenty of fluids and obtain plenty of rest.  Return to the emergency department for any worsening headache, any weakness or numbness of any arm or leg confusion or slurred speech.  Also return for any return of/worsening chest pain or shortness of breath.  Otherwise please follow-up with your primary care doctor in the next 2 to 3 days for recheck/reevaluation.

## 2024-06-09 NOTE — ED Provider Notes (Addendum)
 [facility] Provider Note    Event Date/Time   First MD Initiated Contact with Patient 06/09/24 1353     (approximate)  History   Chief Complaint: Headache  HPI  Kristine Gilbert is a 63 y.o. female with a past medical history of asthma, diabetes, hypertension, hyperlipidemia, DVT/PE on Eliquis  who presents to the emergency department for headache as well as chest discomfort.  According to the patient yesterday she developed a headache she says moderate in severity.  She checked her blood pressure and it was elevated to the 160s and she began having some chest tightness as well.  Patient states the symptoms lasted most of the night however largely resolved this morning besides the headache which has continued although mild currently per patient.  Patient does not wish for any medications for the headache.  Patient states as she is on the blood thinner she was concerned so she came to the emergency department.  Patient states she has not missed any doses of the blood thinner since being diagnosed with a PE 3 to 4 months ago.  Physical Exam   Triage Vital Signs: ED Triage Vitals [06/09/24 1256]  Encounter Vitals Group     BP 136/79     Girls Systolic BP Percentile      Girls Diastolic BP Percentile      Boys Systolic BP Percentile      Boys Diastolic BP Percentile      Pulse Rate 84     Resp 20     Temp 98.2 F (36.8 C)     Temp src      SpO2 100 %     Weight      Height      Head Circumference      Peak Flow      Pain Score      Pain Loc      Pain Education      Exclude from Growth Chart     Most recent vital signs: Vitals:   06/09/24 1256  BP: 136/79  Pulse: 84  Resp: 20  Temp: 98.2 F (36.8 C)  SpO2: 100%    General: Awake, no distress.  CV:  Good peripheral perfusion.  Regular rate and rhythm  Resp:  Normal effort.  Equal breath sounds bilaterally.  Abd:  No distention.  Soft, nontender.  No rebound or guarding.  ED Results /  Procedures / Treatments   EKG  EKG viewed and interpreted by myself shows a normal sinus rhythm 81 bpm with a narrow QRS, normal axis, normal intervals, no concerning ST changes.  RADIOLOGY  I have reviewed and interpreted the x-ray images.  No consolidation on my evaluation. Radiology has read the x-ray as negative.   MEDICATIONS ORDERED IN ED: Medications - No data to display   IMPRESSION / MDM / ASSESSMENT AND PLAN / ED COURSE  I reviewed the triage vital signs and the nursing notes.  Patient's presentation is most consistent with acute presentation with potential threat to life or bodily function.  Patient presents emergency department for acute onset of headache as well as some chest tightness/discomfort.  Overall the patient appears well, blood pressure she states was 162 systolic at home which concerned her, currently 136/79.  Patient's lab work today has resulted reassuring with a normal CBC reassuring chemistry negative troponin.  Chest x-ray is clear and EKG shows no concerning findings.  However given the patient's significant headache that started yesterday while on Eliquis  with  no history of migraines, we will obtain CT imaging of the head as a precaution.  Patient agreeable to plan of care.  Patient does not wish for any medications for the headache given nonnarcotic medication.  If the CT is negative anticipate the patient could be discharged home with outpatient follow-up.  Patient is agreeable to this plan as well.  CT scan is negative.  Will discharge with outpatient follow-up.  FINAL CLINICAL IMPRESSION(S) / ED DIAGNOSES   Headache Chest tightness   Note:  This document was prepared using Dragon voice recognition software and may include unintentional dictation errors.   Dorothyann Drivers, MD 06/09/24 1434    Dorothyann Drivers, MD 06/09/24 [phone]

## 2024-06-09 NOTE — ED Triage Notes (Signed)
 First nurse note: Brought from [facility]. C/o severe headache, chest tightness, blurry vision, and dizziness that started yesterday around 4pm. Reports some relief with meds at home. Reports at this time, still having lingering headache.   Patient reports currently taking Eliquis  for blood clots.   [facility] vitals: 123/76 b/p 85HR 97% RA 98.1oral

## 2024-07-11 ENCOUNTER — Ambulatory Visit: Admitting: Sleep Medicine

## 2024-07-14 ENCOUNTER — Telehealth: Payer: Self-pay | Admitting: Oncology

## 2024-07-14 NOTE — Telephone Encounter (Signed)
 Called pt to confirm that 12/8 was still a good date/time for CT - pt confirmed - LH

## 2024-07-18 ENCOUNTER — Ambulatory Visit: Admission: RE | Admit: 2024-07-18 | Discharge: 2024-07-18 | Attending: Oncology

## 2024-07-18 DIAGNOSIS — I2699 Other pulmonary embolism without acute cor pulmonale: Secondary | ICD-10-CM

## 2024-07-18 LAB — POCT I-STAT CREATININE: Creatinine, Ser: 0.9 mg/dL (ref 0.44–1.00)

## 2024-07-18 MED ORDER — IOHEXOL 350 MG/ML SOLN
75.0000 mL | Freq: Once | INTRAVENOUS | Status: AC | PRN
  Administered 2024-07-18: 75 mL via INTRAVENOUS

## 2024-07-20 ENCOUNTER — Encounter: Payer: Self-pay | Admitting: Oncology

## 2024-08-01 ENCOUNTER — Encounter: Payer: Self-pay | Admitting: Oncology

## 2024-08-01 ENCOUNTER — Inpatient Hospital Stay: Attending: Oncology

## 2024-08-01 ENCOUNTER — Inpatient Hospital Stay: Admitting: Oncology

## 2024-08-01 VITALS — BP 130/80 | HR 95 | Temp 97.0°F | Resp 18 | Wt 155.3 lb

## 2024-08-01 DIAGNOSIS — Z86718 Personal history of other venous thrombosis and embolism: Secondary | ICD-10-CM

## 2024-08-01 DIAGNOSIS — Z86711 Personal history of pulmonary embolism: Secondary | ICD-10-CM | POA: Diagnosis not present

## 2024-08-01 DIAGNOSIS — I2699 Other pulmonary embolism without acute cor pulmonale: Secondary | ICD-10-CM

## 2024-08-01 DIAGNOSIS — Z7901 Long term (current) use of anticoagulants: Secondary | ICD-10-CM | POA: Insufficient documentation

## 2024-08-01 LAB — CMP (CANCER CENTER ONLY)
ALT: 17 U/L (ref 0–44)
AST: 19 U/L (ref 15–41)
Albumin: 4.1 g/dL (ref 3.5–5.0)
Alkaline Phosphatase: 56 U/L (ref 38–126)
Anion gap: 10 (ref 5–15)
BUN: 13 mg/dL (ref 8–23)
CO2: 25 mmol/L (ref 22–32)
Calcium: 9.4 mg/dL (ref 8.9–10.3)
Chloride: 101 mmol/L (ref 98–111)
Creatinine: 0.91 mg/dL (ref 0.44–1.00)
GFR, Estimated: >60 mL/min
Glucose, Bld: 184 mg/dL — ABNORMAL HIGH (ref 70–99)
Potassium: 3.9 mmol/L (ref 3.5–5.1)
Sodium: 136 mmol/L (ref 135–145)
Total Bilirubin: 0.7 mg/dL (ref 0.0–1.2)
Total Protein: 6.5 g/dL (ref 6.5–8.1)

## 2024-08-01 LAB — CBC WITH DIFFERENTIAL (CANCER CENTER ONLY)
Abs Immature Granulocytes: 0.01 K/uL (ref 0.00–0.07)
Basophils Absolute: 0 K/uL (ref 0.0–0.1)
Basophils Relative: 0 %
Eosinophils Absolute: 0 K/uL (ref 0.0–0.5)
Eosinophils Relative: 0 %
HCT: 46.2 % — ABNORMAL HIGH (ref 36.0–46.0)
Hemoglobin: 15.6 g/dL — ABNORMAL HIGH (ref 12.0–15.0)
Immature Granulocytes: 0 %
Lymphocytes Relative: 46 %
Lymphs Abs: 2 K/uL (ref 0.7–4.0)
MCH: 30.1 pg (ref 26.0–34.0)
MCHC: 33.8 g/dL (ref 30.0–36.0)
MCV: 89 fL (ref 80.0–100.0)
Monocytes Absolute: 0.4 K/uL (ref 0.1–1.0)
Monocytes Relative: 9 %
Neutro Abs: 1.9 K/uL (ref 1.7–7.7)
Neutrophils Relative %: 45 %
Platelet Count: 208 K/uL (ref 150–400)
RBC: 5.19 MIL/uL — ABNORMAL HIGH (ref 3.87–5.11)
RDW: 13.2 % (ref 11.5–15.5)
WBC Count: 4.2 K/uL (ref 4.0–10.5)
nRBC: 0 % (ref 0.0–0.2)

## 2024-08-01 NOTE — Progress Notes (Signed)
 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 repeat left lower extremity ultrasound after 3 months of anticoagulation showed subacute DVT.  Recommend patient to repeat another left lower extremity ultrasound. Continue Eliquis  5 mg twice daily.  If left lower extremity ultrasound showed no acute DVT, plan to decrease Eliquis  to 2.5 mg twice daily   History of DVT (deep vein thrombosis) Continue anticoagulation.  See above management   No orders of the defined types were placed in this encounter.  Follow-up in 6 months. All questions were answered. The patient knows to call the clinic with any problems, questions or concerns.  Zelphia Cap, MD, PhD [facility] Hematology Oncology 08/01/2024   HISTORY OF PRESENTING ILLNESS:   Kristine Gilbert is a  63 y.o.  female with PMH listed below was seen in consultation at the request of  Sherial Bail, MD  for evaluation of recent history of acute left lower extremity DVT, acute right lung pulmonary embolism.  Patient reports that for the past 4 months, patient is in pain experiencing worsening of shortness of breath and chest tightness, weakness after exertion He presented to the emergency room for evaluation.  12/30/2023, patient underwent left unilateral lower extremity ultrasound showed DVT in  a left peroneal vein below the knee.  Patient was started on Eliquis  starting dose 10 mg twice daily.  01/03/2024, patient presented emergency room due to increasing shortness of breath, chest tightness and also she feels that left-sided weakness is worse after the diagnosis of left lower extremity blood clot.  In the emergency room, CT chest angiogram showed right segmental and subsegmental pulmonary embolism. MRI brain was negative for acute stroke. Patient was started on IV heparin  drip. Vascular surgeon was consulted and recommended no mechanical thrombectomy. Repeat lower extremity DVT shows stable left peroneal DVT with no progression. Eliquis  failure was felt to be unlikely given her history of ongoing shortness of breath, and pulmonary embolism may have present at the time of left leg DVT diagnosis.  Decision was made to continue on Eliquis  at discharge.  For left sided weakness, patient has history of stroke.  MRI of the brain was negative for stroke.  Patient has history of long-haul COVID.  04/15/2024 left lower extremity ultrasound showed persistent isolated calf vein DVT with one of the paired peroneal veins.  INTERVAL HISTORY Kristine Gilbert is a 63 y.o. female who has above history reviewed by me today presents for follow up visit for unprovoked pulmonary embolism and left lower extremity DVT. Shortness of breath has improved.  Patient is accompanied by her daughter.    07/18/2024 CT chest angiogram PE protocol showed  1. No acute findings in the chest. Specifically, no evidence for acute pulmonary embolus. 2. Subtle linear filling defect in the interlobar pulmonary artery is compatible with chronic fibrinous thrombus. This is at the location of the acute thrombus seen previously. 3. Stable 5 mm left lower lobe pulmonary nodule.   MEDICAL HISTORY:  Past Medical History:  Diagnosis Date   Asthma    Diabetes mellitus without complication (HCC)    DVT (deep venous  thrombosis) (HCC) 12/31/2023   on eliquis    Hiatal hernia    Hypercholesteremia    Hypertension    Long COVID    Pulmonary embolism (HCC)     SURGICAL HISTORY: Past Surgical History:  Procedure Laterality Date   CHOLECYSTECTOMY      SOCIAL HISTORY: Social History   Socioeconomic History   Marital status: Single    Spouse name: Not on file   Number of children: Not on file   Years of education: Not on file   Highest education level: Not on file  Occupational History   Not on file  Tobacco Use   Smoking status: Never   Smokeless tobacco: Never  Vaping Use   Vaping status: Never Used  Substance and Sexual Activity   Alcohol use: No   Drug use: Not Currently   Sexual activity: Not Currently    Birth control/protection: Surgical    Comment: Ablation  Other Topics Concern   Not on file  Social History Narrative   Not on file   Social Drivers of Health   Tobacco Use: Low Risk (08/01/2024)   Patient History    Smoking Tobacco Use: Never    Smokeless Tobacco Use: Never    Passive Exposure: Not on file  Financial Resource Strain: Low Risk  (07/12/2024)   Received from [facility]   Overall Financial Resource Strain (CARDIA)    Difficulty of Paying Living Expenses: Not hard at all  Recent Concern: Financial Resource Strain - High Risk (06/30/2024)   Received from [facility]   Overall Financial Resource Strain (CARDIA)    Difficulty of Paying Living Expenses: Very hard  Food Insecurity: No Food Insecurity (07/12/2024)   Received from [facility]   Epic    Within the past 12 months, you worried that your food would run out before you got the money to buy more.: Never true    Within the past 12 months, the food you bought just didn't last and you didn't have money to get more.: Never true  Recent Concern: Food Insecurity - Food Insecurity Present (06/30/2024)   Received from [facility]   Epic     Within the past 12 months, you worried that your food would run out before you got the money to buy more.: Sometimes true    Within the past 12 months, the food you bought just didn't last and you didn't have money to get more.: Often true  Transportation Needs: No Transportation Needs (07/12/2024)   Received from [facility] - Transportation    In the past 12 months, has lack of transportation kept you from medical appointments or from getting medications?: No    Lack of Transportation (Non-Medical): No  Recent Concern: Transportation Needs - Unmet Transportation Needs (06/30/2024)   Received from [facility] - Transportation    In the past 12 months, has lack of transportation kept you from medical appointments or from getting medications?: Yes    Lack of Transportation (Non-Medical): Yes  Physical Activity: Insufficiently Active (06/30/2024)   Received from [facility]   Exercise Vital Sign    On average, how many days per week do you engage in moderate to strenuous exercise (like a brisk walk)?: 5 days  On average, how many minutes do you engage in exercise at this level?: 20 min  Stress: Stress Concern Present (06/30/2024)   Received from [facility] - Occupational Stress Questionnaire    Feeling of Stress : To some extent  Social Connections: Moderately Integrated (06/30/2024)   Received from [facility]   Social Connection and Isolation Panel    In a typical week, how many times do you talk on the phone with family, friends, or neighbors?: More than three times a week    How often do you get together with friends or relatives?: Twice a week    How often do you attend church or religious services?: More than 4 times per year    Do you belong to any clubs or organizations such as church groups, unions, fraternal or athletic groups,  or school groups?: Yes    How often do you attend meetings of the clubs or organizations you belong to?: More than 4 times per year    Are you married, widowed, divorced, separated, never married, or living with a partner?: Divorced  Intimate Partner Violence: Not At Risk (01/14/2024)   Humiliation, Afraid, Rape, and Kick questionnaire    Fear of Current or Ex-Partner: No    Emotionally Abused: No    Physically Abused: No    Sexually Abused: No  Depression (PHQ2-9): Not on file  Alcohol Screen: Not on file  Housing: Low Risk  (07/28/2024)   Received from [facility]   Epic    In the last 12 months, was there a time when you were not able to pay the mortgage or rent on time?: No    In the past 12 months, how many times have you moved where you were living?: 0    At any time in the past 12 months, were you homeless or living in a shelter (including now)?: No  Recent Concern: Housing - High Risk (06/30/2024)   Received from [facility]   Epic    In the last 12 months, was there a time when you were not able to pay the mortgage or rent on time?: Yes    In the past 12 months, how many times have you moved where you were living?: 0    At any time in the past 12 months, were you homeless or living in a shelter (including now)?: No  Utilities: Not At Risk (07/12/2024)   Received from [facility]   Epic    In the past 12 months has the electric, gas, oil, or water company threatened to shut off services in your home?: No  Recent Concern: Utilities - At Risk (06/30/2024)   Received from [facility]   Epic    In the past 12 months has the electric, gas, oil, or water company threatened to shut off services in your home?: Yes  Health Literacy: Inadequate Health Literacy (06/30/2024)   Received from [facility]   [phone] Health Literacy    How often do you need to have someone help you when you read  instructions, pamphlets, or other written material from your doctor or pharmacy?: Often    FAMILY HISTORY: Family History  Problem Relation Age of Onset   Diabetes Mother    Cancer Mother    Diabetes Father    Throat cancer Father    Cancer Maternal Aunt    Cancer  Maternal Uncle     ALLERGIES:  is allergic to codeine and sulfamethoxazole-trimethoprim.  MEDICATIONS:  Current Outpatient Medications  Medication Sig Dispense Refill   Acetylcysteine 600 MG CAPS Take 600 mg by mouth.     albuterol  (PROVENTIL  HFA;VENTOLIN  HFA) 108 (90 Base) MCG/ACT inhaler Inhale 2 puffs into the lungs every 6 (six) hours as needed for wheezing or shortness of breath. 1 Inhaler 2   apixaban  (ELIQUIS ) 5 MG TABS tablet Take 1 tablet (5 mg total) by mouth 2 (two) times daily. 60 tablet 3   atorvastatin  (LIPITOR) 40 MG tablet Take 40 mg by mouth daily.     ATROVENT HFA 17 MCG/ACT inhaler Inhale 2 puffs into the lungs every 4 (four) hours as needed for wheezing (or cough).     benralizumab (FASENRA PEN) 30 MG/ML prefilled autoinjector Inject 30 mg into the skin.     cholecalciferol  (VITAMIN D ) 1000 units tablet Take 1,000 Units by mouth daily.     Continuous Glucose Sensor (FREESTYLE LIBRE 3 PLUS SENSOR) MISC      estradiol  (ESTRACE ) 0.1 MG/GM vaginal cream PLACE 0.25 APPLICATORFUL VAGINALLY AT BEDTIME 85 g 3   fluticasone  (FLONASE ) 50 MCG/ACT nasal spray Place 1 spray into both nostrils daily as needed for allergies or rhinitis.     furosemide (LASIX) 20 MG tablet Take 20 mg by mouth daily.     gabapentin  (NEURONTIN ) 100 MG capsule Take 100 mg by mouth 2 (two) times daily.     HYDROcodone -acetaminophen  (NORCO/VICODIN) 5-325 MG tablet Take 1 tablet by mouth every 4 (four) hours as needed. 12 tablet 0   ipratropium (ATROVENT) 0.03 % nasal spray Place 1 spray into the nose 2 (two) times daily as needed for rhinitis.     ipratropium-albuterol  (DUONEB) 0.5-2.5 (3) MG/3ML SOLN Inhale 3 mLs into the lungs 4 (four)  times daily as needed (Wheezing).     JARDIANCE  25 MG TABS tablet Take 25 mg by mouth daily.     levocetirizine (XYZAL) 5 MG tablet Take 5 mg by mouth every evening.     meloxicam (MOBIC) 15 MG tablet Take 15 mg by mouth daily.     metFORMIN (GLUCOPHAGE) 500 MG tablet Take 1,000 mg by mouth 2 (two) times daily with a meal.     omeprazole (PRILOSEC) 40 MG capsule Take 40 mg by mouth daily.     OZEMPIC, 2 MG/DOSE, 8 MG/3ML SOPN Inject 2 mg into the skin once a week.     pioglitazone  (ACTOS ) 15 MG tablet Take 15 mg by mouth daily.     potassium chloride (KLOR-CON) 10 MEQ tablet Take 10 mEq by mouth daily.     rizatriptan (MAXALT-MLT) 10 MG disintegrating tablet Take 10 mg by mouth as needed for migraine. May repeat in 2 hours if needed     sertraline  (ZOLOFT ) 50 MG tablet Take 75 mg by mouth daily.     tiZANidine (ZANAFLEX) 4 MG tablet Take 4 mg by mouth 2 (two) times daily.     TRELEGY ELLIPTA 100-62.5-25 MCG/ACT AEPB Inhale 1 puff into the lungs daily.     valsartan -hydrochlorothiazide  (DIOVAN -HCT) 320-12.5 MG tablet Take 1 tablet by mouth daily.     vitamin B-12 (CYANOCOBALAMIN ) 1000 MCG tablet Take 1,000 mcg by mouth daily.     Vitamin D , Ergocalciferol , (DRISDOL) 1.25 MG (50000 UNIT) CAPS capsule Take 50,000 Units by mouth once a week.     No current facility-administered medications for this visit.    Review of Systems  Constitutional:  Negative for appetite change, chills, fatigue and fever.  HENT:   Negative for hearing loss and voice change.   Eyes:  Negative for eye problems.  Respiratory:  Negative for chest tightness, cough and shortness of breath.   Cardiovascular:  Negative for chest pain.  Gastrointestinal:  Negative for abdominal distention, abdominal pain and blood in stool.  Endocrine: Negative for hot flashes.  Genitourinary:  Negative for difficulty urinating and frequency.   Musculoskeletal:  Negative for arthralgias.  Skin:  Negative for itching and rash.   Neurological:  Positive for extremity weakness.  Hematological:  Negative for adenopathy.  Psychiatric/Behavioral:  Negative for confusion.    PHYSICAL EXAMINATION: ECOG PERFORMANCE STATUS: 1 - Symptomatic but completely ambulatory Vitals:   08/01/24 1031  BP: 130/80  Pulse: 95  Resp: 18  Temp: (!) 97 F (36.1 C)  SpO2: 99%   Filed Weights   08/01/24 1031  Weight: 155 lb 4.8 oz (70.4 kg)    Physical Exam Constitutional:      General: She is not in acute distress.    Comments: Patient walks with a walker  HENT:     Head: Normocephalic and atraumatic.  Eyes:     General: No scleral icterus. Cardiovascular:     Rate and Rhythm: Normal rate and regular rhythm.     Heart sounds: Normal heart sounds.  Pulmonary:     Effort: Pulmonary effort is normal. No respiratory distress.     Breath sounds: No wheezing.  Abdominal:     General: Bowel sounds are normal. There is no distension.  Musculoskeletal:        General: No deformity. Normal range of motion.     Cervical back: Normal range of motion and neck supple.  Skin:    General: Skin is warm and dry.     Findings: No erythema or rash.  Neurological:     Mental Status: She is alert and oriented to person, place, and time. Mental status is at baseline.  Psychiatric:        Mood and Affect: Mood normal.     LABORATORY DATA:  I have reviewed the data as listed    Latest Ref Rng & Units 08/01/2024   10:11 AM 06/09/2024    1:46 PM 04/21/2024    2:04 PM  CBC  WBC 4.0 - 10.5 K/uL 4.2  6.6  5.8   Hemoglobin 12.0 - 15.0 g/dL 84.3  83.9  84.7   Hematocrit 36.0 - 46.0 % 46.2  47.8  46.5   Platelets 150 - 400 K/uL 208  235  199       Latest Ref Rng & Units 08/01/2024   10:11 AM 07/18/2024   10:00 AM 06/09/2024    1:46 PM  CMP  Glucose 70 - 99 mg/dL 815   813   BUN 8 - 23 mg/dL 13   22   Creatinine 9.55 - 1.00 mg/dL 9.08  9.09  9.08   Sodium 135 - 145 mmol/L 136   138   Potassium 3.5 - 5.1 mmol/L 3.9   4.3    Chloride 98 - 111 mmol/L 101   100   CO2 22 - 32 mmol/L 25   24   Calcium  8.9 - 10.3 mg/dL 9.4   9.6   Total Protein 6.5 - 8.1 g/dL 6.5     Total Bilirubin 0.0 - 1.2 mg/dL 0.7     Alkaline Phos 38 - 126 U/L 56     AST 15 -  41 U/L 19     ALT 0 - 44 U/L 17         RADIOGRAPHIC STUDIES: I have personally reviewed the radiological images as listed and agreed with the findings in the report. CT Angio Chest Pulmonary Embolism (PE) W or WO Contrast Result Date: 07/18/2024 CLINICAL DATA:  History of PE. EXAM: CT ANGIOGRAPHY CHEST WITH CONTRAST TECHNIQUE: Multidetector CT imaging of the chest was performed using the standard protocol during bolus administration of intravenous contrast. Multiplanar CT image reconstructions and MIPs were obtained to evaluate the vascular anatomy. RADIATION DOSE REDUCTION: This exam was performed according to the departmental dose-optimization program which includes automated exposure control, adjustment of the mA and/or kV according to patient size and/or use of iterative reconstruction technique. CONTRAST:  75mL OMNIPAQUE  IOHEXOL  350 MG/ML SOLN COMPARISON:  01/03/2024 FINDINGS: Cardiovascular: The heart size is normal. No substantial pericardial effusion. There is no filling defect within the opacified pulmonary arteries to suggest the presence of an acute pulmonary embolus. A subtle linear filling defect in the interlobar pulmonary artery (104/5) is compatible with chronic fibrinous thrombus. This is at the location of the acute thrombus seen previously. Mediastinum/Nodes: No mediastinal lymphadenopathy. There is no hilar lymphadenopathy. The esophagus has normal imaging features. There is no axillary lymphadenopathy. Lungs/Pleura: 5 mm left lower lobe pulmonary nodule is stable in the interval. Dependent atelectasis. Upper Abdomen: Visualized portion of the upper abdomen shows no acute findings. Musculoskeletal: No worrisome lytic or sclerotic osseous abnormality. Review of  the MIP images confirms the above findings. IMPRESSION: 1. No acute findings in the chest. Specifically, no evidence for acute pulmonary embolus. 2. Subtle linear filling defect in the interlobar pulmonary artery is compatible with chronic fibrinous thrombus. This is at the location of the acute thrombus seen previously. 3. Stable 5 mm left lower lobe pulmonary nodule. Electronically Signed   By: Camellia Candle M.D.   On: 07/18/2024 11:25   CT HEAD WO CONTRAST ( ) Result Date: 06/09/2024 CLINICAL DATA:  Severe headache with chest tightness and blurry vision as well as dizziness beginning yesterday. Patient on Eliquis . EXAM: CT HEAD WITHOUT CONTRAST TECHNIQUE: Contiguous axial images were obtained from the base of the skull through the vertex without intravenous contrast. RADIATION DOSE REDUCTION: This exam was performed according to the departmental dose-optimization program which includes automated exposure control, adjustment of the mA and/or kV according to patient size and/or use of iterative reconstruction technique. COMPARISON:  01/08/2024 FINDINGS: Brain: Ventricles, cisterns and other CSF spaces are normal. There is no mass, mass effect, shift of midline structures or acute hemorrhage. No evidence of acute infarction. Vascular: No hyperdense vessel or unexpected calcification. Skull: Normal. Negative for fracture or focal lesion. Sinuses/Orbits: No acute finding. Other: None. IMPRESSION: No acute findings. Electronically Signed   By: Toribio Agreste M.D.   On: 06/09/2024 14:57   DG Chest 2 View Result Date: 06/09/2024 EXAM: 2 VIEW(S) XRAY OF THE CHEST 06/09/2024 01:36:00 PM COMPARISON: 01/03/2024 CLINICAL HISTORY: chest pain chest pain FINDINGS: LUNGS AND PLEURA: No focal pulmonary opacity. No pulmonary edema. No pleural effusion. No pneumothorax. HEART AND MEDIASTINUM: No acute abnormality of the cardiac and mediastinal silhouettes. BONES AND SOFT TISSUES: No acute osseous abnormality. IMPRESSION: 1.  No acute cardiopulmonary abnormality. Electronically signed by: Lynwood Seip MD 06/09/2024 01:59 PM EDT RP Workstation: [id number]   MR LUMBAR SPINE WO CONTRAST Result Date: 06/07/2024 EXAM: MRI LUMBAR SPINE 06/05/2024 04:21:12 PM TECHNIQUE: Multiplanar multisequence MRI of the lumbar spine was performed without the administration  of intravenous contrast. COMPARISON: None available. CLINICAL HISTORY: Chronic lower back and right knee pain, no injury. FINDINGS: BONES AND ALIGNMENT: Normal alignment. Normal vertebral body heights. Bone marrow signal is unremarkable. Transitional lumbosacral vertebrae have mostly sacral characteristics and is considered to represent the S5 level. Careful correlation with this numbering strategy is recommended if procedural intervention is to be performed. Disc desiccation at L4-L5 and L5-S1 with loss of disc height at L5-S1. 3 mm degenerative retrolisthesis at L5-S1. SPINAL CORD: Normal appearing conus medullaris terminates at the L1-L2 level. SOFT TISSUES: No paraspinal mass. L1-L2: Unremarkable. L2-L3: Unremarkable. L3-L4: No impingement. Right eccentric disc bulge. L4-L5: No impingement. Disc bulge, left greater than right. Degenerative facet arthropathy. L5-S1: Moderate central stenosis with borderline bilateral foraminal stenosis and moderate bilateral subarticular lateral recess stenosis. Disc bulge, central disc protrusion, and mild degenerative facet arthropathy with ligamentum flavum redundancy. S1-S2: Rudimentary disc, no impingement. IMPRESSION: 1. Moderate central stenosis at L5-S1 with borderline bilateral foraminal stenosis and moderate bilateral subarticular lateral recess stenosis, with disc bulge and central disc protrusion, and mild degenerative facet arthropathy with ligamentum flavum redundancy. 2. Disc desiccation at L4-5 and L5-S1 with loss of disc height at L5-S1 and 3 mm degenerative retrolisthesis at L5-S1. 3. Right eccentric disc bulge at L3-4 without  impingement. 4. Disc bulge and left greater than right degenerative facet arthropathy at L4-5 without impingement. Electronically signed by: Ryan Salvage MD 06/07/2024 03:43 PM EDT RP Workstation: [id number]   MR KNEE RIGHT WO CONTRAST Result Date: 06/05/2024 MR KNEE WITHOUT IV CONTRAST RIGHT COMPARISON: None. CLINICAL HISTORY: Low back pain and right knee pain. PULSE SEQUENCES: Ax PD FS, Sag T2 ACL, Sag PD FS, Cor PD FS & COR T1 FINDINGS: Bones: There is no fracture or contusion pattern. There is mild chondromalacia without full-thickness cartilage defect. There is mild subchondral edema in the medial femoral condyle. There is a small reactive joint effusion. Extensor mechanism is intact. Trace edema may be present in the medial insertion of the quadriceps. There is mild edema in the lateral gastrocnemius muscle suggesting a mild strain. Correlation for posterior lateral knee pain. Ligaments: The ACL, PCL, MCL and fibular collateral ligament are intact. Menisci: The medial lateral meniscus are unremarkable. No displaced meniscal tear is present. IMPRESSION: No acute bony abnormality. No accelerated arthrosis. No significant joint effusion. No ligamentous or meniscal injury. Mild edema in the lateral gastrocnemius muscle. Correlation for pain in the posterior lateral knee. No significant tear is appreciated. Trace edema is suggested in the medial insertion of the quadriceps mechanism. No significant injury. Electronically signed by: Norleen Satchel MD 06/05/2024 04:28 PM EDT RP Workstation: [id number]   US  Venous Img Lower Unilateral Right (DVT) Result Date: 05/24/2024 CLINICAL DATA:  Pain and swelling of the right lower extremity for the past month EXAM: RIGHT LOWER EXTREMITY VENOUS DOPPLER ULTRASOUND TECHNIQUE: Gray-scale sonography with compression, as well as color and duplex ultrasound, were performed to evaluate the deep venous system(s) from the level of the common femoral vein through the  popliteal and proximal calf veins. COMPARISON:  None Available. FINDINGS: VENOUS Normal compressibility of the common femoral, superficial femoral, and popliteal veins, as well as the visualized calf veins. Visualized portions of profunda femoral vein and great saphenous vein unremarkable. No filling defects to suggest DVT on grayscale or color Doppler imaging. Doppler waveforms show normal direction of venous flow, normal respiratory plasticity and response to augmentation. Limited views of the contralateral common femoral vein are unremarkable. OTHER None. Limitations: none IMPRESSION: Negative. Electronically  Signed   By: Wilkie Lent M.D.   On: 05/24/2024 15:43         "

## 2024-08-01 NOTE — Assessment & Plan Note (Signed)
 Continue anticoagulation.  See above management

## 2024-08-01 NOTE — Assessment & Plan Note (Addendum)
 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 repeat left lower extremity ultrasound after 3 months of anticoagulation showed subacute DVT.  Recommend patient to repeat another left lower extremity ultrasound. Continue Eliquis  5 mg twice daily.  If left lower extremity ultrasound showed no acute DVT, plan to decrease Eliquis  to 2.5 mg twice daily

## 2024-08-03 ENCOUNTER — Ambulatory Visit
Admission: RE | Admit: 2024-08-03 | Discharge: 2024-08-03 | Disposition: A | Discharge status: Home / Self Care | Source: Ambulatory Visit | Attending: Oncology | Admitting: Oncology

## 2024-08-03 DIAGNOSIS — I82452 Acute embolism and thrombosis of left peroneal vein: Secondary | ICD-10-CM | POA: Insufficient documentation

## 2024-08-08 ENCOUNTER — Ambulatory Visit: Payer: Self-pay | Admitting: Oncology

## 2024-08-08 ENCOUNTER — Other Ambulatory Visit: Payer: Self-pay | Admitting: Oncology

## 2024-08-08 MED ORDER — APIXABAN 2.5 MG PO TABS: 2.5000 mg | ORAL_TABLET | Freq: Two times a day (BID) | ORAL | 1 refills | Status: AC

## 2024-09-13 ENCOUNTER — Encounter

## 2024-09-14 ENCOUNTER — Ambulatory Visit (INDEPENDENT_AMBULATORY_CARE_PROVIDER_SITE_OTHER): Payer: Self-pay

## 2024-09-14 DIAGNOSIS — F321 Major depressive disorder, single episode, moderate: Secondary | ICD-10-CM | POA: Insufficient documentation
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
## 2024-09-20 ENCOUNTER — Encounter

## 2024-09-22 ENCOUNTER — Ambulatory Visit

## 2024-09-29 ENCOUNTER — Ambulatory Visit

## 2024-10-05 ENCOUNTER — Ambulatory Visit

## 2024-12-05 ENCOUNTER — Ambulatory Visit: Admitting: Psychiatry

## 2025-02-07 ENCOUNTER — Inpatient Hospital Stay: Admitting: Oncology

## 2025-02-07 ENCOUNTER — Inpatient Hospital Stay
